# Patient Record
Sex: Male | Born: 1960 | Race: White | Hispanic: No | Marital: Single | Smoking: Never smoker
Health system: Southern US, Community
[De-identification: ages and names within clinical notes are randomized; demographics above are authoritative.]

## PROBLEM LIST (undated history)

## (undated) DIAGNOSIS — T7840XA Allergy, unspecified, initial encounter: Secondary | ICD-10-CM

## (undated) DIAGNOSIS — N200 Calculus of kidney: Secondary | ICD-10-CM

## (undated) DIAGNOSIS — I10 Essential (primary) hypertension: Secondary | ICD-10-CM

## (undated) DIAGNOSIS — D689 Coagulation defect, unspecified: Secondary | ICD-10-CM

## (undated) DIAGNOSIS — Z87442 Personal history of urinary calculi: Secondary | ICD-10-CM

## (undated) HISTORY — DX: Calculus of kidney: N20.0

## (undated) HISTORY — PX: HERNIA REPAIR: SHX51

## (undated) HISTORY — DX: Allergy, unspecified, initial encounter: T78.40XA

## (undated) HISTORY — PX: OTHER SURGICAL HISTORY: SHX169

## (undated) HISTORY — DX: Coagulation defect, unspecified: D68.9

## (undated) HISTORY — DX: Essential (primary) hypertension: I10

## (undated) HISTORY — PX: TONSILLECTOMY: SUR1361

## (undated) HISTORY — PX: FRACTURE SURGERY: SHX138

---

## 1996-02-29 HISTORY — PX: OPEN REPAIR PERIARTICULAR FRACTURE / DISLOCATION ELBOW: SUR901

## 2003-08-19 ENCOUNTER — Emergency Department (HOSPITAL_COMMUNITY): Admission: EM | Admit: 2003-08-19 | Discharge: 2003-08-19 | Payer: Self-pay | Admitting: Emergency Medicine

## 2011-02-17 ENCOUNTER — Ambulatory Visit (INDEPENDENT_AMBULATORY_CARE_PROVIDER_SITE_OTHER): Payer: 59

## 2011-02-17 DIAGNOSIS — R05 Cough: Secondary | ICD-10-CM

## 2011-02-17 DIAGNOSIS — J159 Unspecified bacterial pneumonia: Secondary | ICD-10-CM

## 2011-02-17 DIAGNOSIS — R059 Cough, unspecified: Secondary | ICD-10-CM

## 2011-03-01 HISTORY — PX: PLANTAR FASCIA RELEASE: SHX2239

## 2011-04-20 ENCOUNTER — Ambulatory Visit (INDEPENDENT_AMBULATORY_CARE_PROVIDER_SITE_OTHER): Payer: 59 | Admitting: Family Medicine

## 2011-04-20 VITALS — BP 128/89 | HR 72 | Temp 98.4°F | Resp 16 | Ht 65.5 in | Wt 218.0 lb

## 2011-04-20 DIAGNOSIS — J329 Chronic sinusitis, unspecified: Secondary | ICD-10-CM

## 2011-04-20 DIAGNOSIS — I10 Essential (primary) hypertension: Secondary | ICD-10-CM | POA: Insufficient documentation

## 2011-04-20 DIAGNOSIS — J309 Allergic rhinitis, unspecified: Secondary | ICD-10-CM

## 2011-04-20 MED ORDER — FLUTICASONE PROPIONATE 50 MCG/ACT NA SUSP: 2.0000 | Freq: Every day | NASAL | Status: DC

## 2011-04-20 MED ORDER — CEFDINIR 300 MG PO CAPS: 300.0000 mg | ORAL_CAPSULE | Freq: Two times a day (BID) | ORAL | Status: AC

## 2011-04-20 NOTE — Progress Notes (Signed)
  Subjective:    Patient ID: Nathaniel Mosley, male    DOB: 02/11/1961, 51 y.o.   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 His sinuses are flared up again. For the last 5 or 6 days he's been blowing a lot of purulent stuff, facial pain, PND. No fever. Main coughing is from the morning drainage. He had been having symptoms off and on prior to this but this is flared and stayed.    Review of Systems Otherwise unremarkable    Objective:   Physical Exam White male no acute distress TMs normal mild tenderness on sinuses throat clear neck supple without significant nodes. Chest is clear to auscultation. Heart regular without murmurs.,.   Assessment & Plan:  Sinusitis, probably with allergic component.  See discharge instructions. No abscess at this time.

## 2011-04-20 NOTE — Patient Instructions (Signed)

## 2011-07-07 ENCOUNTER — Ambulatory Visit (INDEPENDENT_AMBULATORY_CARE_PROVIDER_SITE_OTHER): Payer: 59 | Admitting: Family Medicine

## 2011-07-07 ENCOUNTER — Encounter: Payer: Self-pay | Admitting: Family Medicine

## 2011-07-07 VITALS — BP 142/93 | HR 68 | Temp 97.9°F | Resp 16 | Ht 66.5 in | Wt 214.8 lb

## 2011-07-07 DIAGNOSIS — N200 Calculus of kidney: Secondary | ICD-10-CM | POA: Insufficient documentation

## 2011-07-07 DIAGNOSIS — I10 Essential (primary) hypertension: Secondary | ICD-10-CM

## 2011-07-07 DIAGNOSIS — J301 Allergic rhinitis due to pollen: Secondary | ICD-10-CM

## 2011-07-07 DIAGNOSIS — J302 Other seasonal allergic rhinitis: Secondary | ICD-10-CM

## 2011-07-07 LAB — LIPID PANEL
Cholesterol: 190 mg/dL (ref 0–200)
HDL: 30 mg/dL — ABNORMAL LOW (ref >39)
LDL Cholesterol: 106 mg/dL — ABNORMAL HIGH (ref 0–99)
Total CHOL/HDL Ratio: 6.3 Ratio
Triglycerides: 270 mg/dL — ABNORMAL HIGH (ref <150)
VLDL: 54 mg/dL — ABNORMAL HIGH (ref 0–40)

## 2011-07-07 LAB — COMPREHENSIVE METABOLIC PANEL
ALT: 17 U/L (ref 0–53)
AST: 19 U/L (ref 0–37)
Albumin: 4.6 g/dL (ref 3.5–5.2)
Alkaline Phosphatase: 72 U/L (ref 39–117)
BUN: 13 mg/dL (ref 6–23)
CO2: 26 mEq/L (ref 19–32)
Calcium: 9.6 mg/dL (ref 8.4–10.5)
Chloride: 105 mEq/L (ref 96–112)
Creat: 0.84 mg/dL (ref 0.50–1.35)
Glucose, Bld: 104 mg/dL — ABNORMAL HIGH (ref 70–99)
Potassium: 3.9 mEq/L (ref 3.5–5.3)
Sodium: 142 mEq/L (ref 135–145)
Total Bilirubin: 0.5 mg/dL (ref 0.3–1.2)
Total Protein: 7.2 g/dL (ref 6.0–8.3)

## 2011-07-07 LAB — CBC
HCT: 46.7 % (ref 39.0–52.0)
Hemoglobin: 16 g/dL (ref 13.0–17.0)
MCH: 29.3 pg (ref 26.0–34.0)
MCHC: 34.3 g/dL (ref 30.0–36.0)
MCV: 85.5 fL (ref 78.0–100.0)
Platelets: 234 10*3/uL (ref 150–400)
RBC: 5.46 MIL/uL (ref 4.22–5.81)
RDW: 13.7 % (ref 11.5–15.5)
WBC: 5.1 10*3/uL (ref 4.0–10.5)

## 2011-07-07 LAB — POCT URINALYSIS DIPSTICK
Bilirubin, UA: NEGATIVE
Blood, UA: NEGATIVE
Glucose, UA: NEGATIVE
Ketones, UA: NEGATIVE
Leukocytes, UA: NEGATIVE
Nitrite, UA: NEGATIVE
Spec Grav, UA: 1.02
Urobilinogen, UA: 0.2
pH, UA: 7.5

## 2011-07-07 LAB — POCT UA - MICROSCOPIC ONLY
Bacteria, U Microscopic: NEGATIVE
Casts, Ur, LPF, POC: NEGATIVE
Crystals, Ur, HPF, POC: NEGATIVE
Mucus, UA: NEGATIVE
WBC, Ur, HPF, POC: NEGATIVE
Yeast, UA: NEGATIVE

## 2011-07-07 LAB — TSH: TSH: 1.664 u[IU]/mL (ref 0.350–4.500)

## 2011-07-07 NOTE — Patient Instructions (Signed)
Allergies, Generic Allergies may happen from anything your body is sensitive to. This may be food, medicines, pollens, chemicals, and nearly anything around you in everyday life that produces allergens. An allergen is anything that causes an allergy producing substance. Heredity is often a factor in causing these problems. This means you may have some of the same allergies as your parents. Food allergies happen in all age groups. Food allergies are some of the most severe and life threatening. Some common food allergies are cow's milk, seafood, eggs, nuts, wheat, and soybeans. SYMPTOMS   Swelling around the mouth.   An itchy red rash or hives.   Vomiting or diarrhea.   Difficulty breathing.  SEVERE ALLERGIC REACTIONS ARE LIFE-THREATENING. This reaction is called anaphylaxis. It can cause the mouth and throat to swell and cause difficulty with breathing and swallowing. In severe reactions only a trace amount of food (for example, peanut oil in a salad) may cause death within seconds. Seasonal allergies occur in all age groups. These are seasonal because they usually occur during the same season every year. They may be a reaction to molds, grass pollens, or tree pollens. Other causes of problems are house dust mite allergens, pet dander, and mold spores. The symptoms often consist of nasal congestion, a runny itchy nose associated with sneezing, and tearing itchy eyes. There is often an associated itching of the mouth and ears. The problems happen when you come in contact with pollens and other allergens. Allergens are the particles in the air that the body reacts to with an allergic reaction. This causes you to release allergic antibodies. Through a chain of events, these eventually cause you to release histamine into the blood stream. Although it is meant to be protective to the body, it is this release that causes your discomfort. This is why you were given anti-histamines to feel better. If you are  unable to pinpoint the offending allergen, it may be determined by skin or blood testing. Allergies cannot be cured but can be controlled with medicine. Hay fever is a collection of all or some of the seasonal allergy problems. It may often be treated with simple over-the-counter medicine such as diphenhydramine. Take medicine as directed. Do not drink alcohol or drive while taking this medicine. Check with your caregiver or package insert for child dosages. If these medicines are not effective, there are many new medicines your caregiver can prescribe. Stronger medicine such as nasal spray, eye drops, and corticosteroids may be used if the first things you try do not work well. Other treatments such as immunotherapy or desensitizing injections can be used if all else fails. Follow up with your caregiver if problems continue. These seasonal allergies are usually not life threatening. They are generally more of a nuisance that can often be handled using medicine. HOME CARE INSTRUCTIONS   If unsure what causes a reaction, keep a diary of foods eaten and symptoms that follow. Avoid foods that cause reactions.   If hives or rash are present:   Take medicine as directed.   You may use an over-the-counter antihistamine (diphenhydramine) for hives and itching as needed.   Apply cold compresses (cloths) to the skin or take baths in cool water. Avoid hot baths or showers. Heat will make a rash and itching worse.   If you are severely allergic:   Following a treatment for a severe reaction, hospitalization is often required for closer follow-up.   Wear a medic-alert bracelet or necklace stating the allergy.     You and your family must learn how to give adrenaline or use an anaphylaxis kit.   If you have had a severe reaction, always carry your anaphylaxis kit or EpiPen with you. Use this medicine as directed by your caregiver if a severe reaction is occurring. Failure to do so could have a fatal  outcome.  SEEK MEDICAL CARE IF:  You suspect a food allergy. Symptoms generally happen within 30 minutes of eating a food.   Your symptoms have not gone away within 2 days or are getting worse.   You develop new symptoms.   You want to retest yourself or your child with a food or drink you think causes an allergic reaction. Never do this if an anaphylactic reaction to that food or drink has happened before. Only do this under the care of a caregiver.  SEEK IMMEDIATE MEDICAL CARE IF:   You have difficulty breathing, are wheezing, or have a tight feeling in your chest or throat.   You have a swollen mouth, or you have hives, swelling, or itching all over your body.   You have had a severe reaction that has responded to your anaphylaxis kit or an EpiPen. These reactions may return when the medicine has worn off. These reactions should be considered life threatening.  MAKE SURE YOU:   Understand these instructions.   Will watch your condition.   Will get help right away if you are not doing well or get worse.  Document Released: 05/10/2002 Document Revised: 02/03/2011 Document Reviewed: 10/15/2007 [facility] Patient Information 2012 [place], [place].Hypertension As your heart beats, it forces blood through your arteries. This force is your blood pressure. If the pressure is too high, it is called hypertension (HTN) or high blood pressure. HTN is dangerous because you may have it and not know it. High blood pressure may mean that your heart has to work harder to pump blood. Your arteries may be narrow or stiff. The extra work puts you at risk for heart disease, stroke, and other problems.  Blood pressure consists of two numbers, a higher number over a lower, 110/72, for example. It is stated as "110 over 72." The ideal is below 120 for the top number (systolic) and under 80 for the bottom (diastolic). Write down your blood pressure today. You should pay close attention to your blood pressure if  you have certain conditions such as:  Heart failure.   Prior heart attack.   Diabetes   Chronic kidney disease.   Prior stroke.   Multiple risk factors for heart disease.  To see if you have HTN, your blood pressure should be measured while you are seated with your arm held at the level of the heart. It should be measured at least twice. A one-time elevated blood pressure reading (especially in the Emergency Department) does not mean that you need treatment. There may be conditions in which the blood pressure is different between your right and left arms. It is important to see your caregiver soon for a recheck. Most people have essential hypertension which means that there is not a specific cause. This type of high blood pressure may be lowered by changing lifestyle factors such as:  Stress.   Smoking.   Lack of exercise.   Excessive weight.   Drug/tobacco/alcohol use.   Eating less salt.  Most people do not have symptoms from high blood pressure until it has caused damage to the body. Effective treatment can often prevent, delay or reduce that damage. TREATMENT  When a cause has been identified, treatment for high blood pressure is directed at the cause. There are a large number of medications to treat HTN. These fall into several categories, and your caregiver will help you select the medicines that are best for you. Medications may have side effects. You should review side effects with your caregiver. If your blood pressure stays high after you have made lifestyle changes or started on medicines,   Your medication(s) may need to be changed.   Other problems may need to be addressed.   Be certain you understand your prescriptions, and know how and when to take your medicine.   Be sure to follow up with your caregiver within the time frame advised (usually within two weeks) to have your blood pressure rechecked and to review your medications.   If you are taking more than  one medicine to lower your blood pressure, make sure you know how and at what times they should be taken. Taking two medicines at the same time can result in blood pressure that is too low.  SEEK IMMEDIATE MEDICAL CARE IF:  You develop a severe headache, blurred or changing vision, or confusion.   You have unusual weakness or numbness, or a faint feeling.   You have severe chest or abdominal pain, vomiting, or breathing problems.  MAKE SURE YOU:   Understand these instructions.   Will watch your condition.   Will get help right away if you are not doing well or get worse.  Document Released: 02/14/2005 Document Revised: 02/03/2011 Document Reviewed: 10/05/2007 [facility] Patient Information 2012 [place], [place].Health Maintenance, Males A healthy lifestyle and preventative care can promote health and wellness.  Maintain regular health, dental, and eye exams.   Eat a healthy diet. Foods like vegetables, fruits, whole grains, low-fat dairy products, and lean protein foods contain the nutrients you need without too many calories. Decrease your intake of foods high in solid fats, added sugars, and salt. Get information about a proper diet from your caregiver, if necessary.   Regular physical exercise is one of the most important things you can do for your health. Most adults should get at least 150 minutes of moderate-intensity exercise (any activity that increases your heart rate and causes you to sweat) each week. In addition, most adults need muscle-strengthening exercises on 2 or more days a week.    Maintain a healthy weight. The body mass index (BMI) is a screening tool to identify possible weight problems. It provides an estimate of body fat based on height and weight. Your caregiver can help determine your BMI, and can help you achieve or maintain a healthy weight. For adults 20 years and older:   A BMI below 18.5 is considered underweight.   A BMI of 18.5 to 24.9 is normal.   A BMI  of 25 to 29.9 is considered overweight.   A BMI of 30 and above is considered obese.   Maintain normal blood lipids and cholesterol by exercising and minimizing your intake of saturated fat. Eat a balanced diet with plenty of fruits and vegetables. Blood tests for lipids and cholesterol should begin at age 89 and be repeated every 5 years. If your lipid or cholesterol levels are high, you are over 50, or you are a high risk for heart disease, you may need your cholesterol levels checked more frequently.Ongoing high lipid and cholesterol levels should be treated with medicines, if diet and exercise are not effective.   If you smoke, find out  from your caregiver how to quit. If you do not use tobacco, do not start.   If you choose to drink alcohol, do not exceed 2 drinks per day. One drink is considered to be 12 ounces (355 mL) of beer, 5 ounces (148 mL) of wine, or 1.5 ounces (44 mL) of liquor.   Avoid use of street drugs. Do not share needles with anyone. Ask for help if you need support or instructions about stopping the use of drugs.   High blood pressure causes heart disease and increases the risk of stroke. Blood pressure should be checked at least every 1 to 2 years. Ongoing high blood pressure should be treated with medicines if weight loss and exercise are not effective.   If you are 10 to 50 years old, ask your caregiver if you should take aspirin to prevent heart disease.   Diabetes screening involves taking a blood sample to check your fasting blood sugar level. This should be done once every 3 years, after age 96, if you are within normal weight and without risk factors for diabetes. Testing should be considered at a younger age or be carried out more frequently if you are overweight and have at least 1 risk factor for diabetes.   Colorectal cancer can be detected and often prevented. Most routine colorectal cancer screening begins at the age of 2 and continues through age 50. However,  your caregiver may recommend screening at an earlier age if you have risk factors for colon cancer. On a yearly basis, your caregiver may provide home test kits to check for hidden blood in the stool. Use of a small camera at the end of a tube, to directly examine the colon (sigmoidoscopy or colonoscopy), can detect the earliest forms of colorectal cancer. Talk to your caregiver about this at age 55, when routine screening begins. Direct examination of the colon should be repeated every 5 to 10 years through age 31, unless early forms of pre-cancerous polyps or small growths are found.   Hepatitis C blood testing is recommended for all people born from [birth year] through 1965 and any individual with known risks for hepatitis C.   Healthy men should no longer receive prostate-specific antigen (PSA) blood tests as part of routine cancer screening. Consult with your caregiver about prostate cancer screening.   Testicular cancer screening is not recommended for adolescents or adult males who have no symptoms. Screening includes self-exam, caregiver exam, and other screening tests. Consult with your caregiver about any symptoms you have or any concerns you have about testicular cancer.   Practice safe sex. Use condoms and avoid high-risk sexual practices to reduce the spread of sexually transmitted infections (STIs).   Use sunscreen with a sun protection factor (SPF) of 30 or greater. Apply sunscreen liberally and repeatedly throughout the day. You should seek shade when your shadow is shorter than you. Protect yourself by wearing long sleeves, pants, a wide-brimmed hat, and sunglasses year round, whenever you are outdoors.   Notify your caregiver of new moles or changes in moles, especially if there is a change in shape or color. Also notify your caregiver if a mole is larger than the size of a pencil eraser.   A one-time screening for abdominal aortic aneurysm (AAA) and surgical repair of large AAAs by sound  wave imaging (ultrasonography) is recommended for ages 56 to 50 years who are current or former smokers.   Stay current with your immunizations.  Document Released: 08/13/2007 Document Revised: 02/03/2011  Document Reviewed: 07/12/2010 [facility] Patient Information 2012 [place], [place].

## 2011-07-07 NOTE — Progress Notes (Signed)
  Patient Name: Nathaniel Mosley Date of Birth: 1960-06-10 Medical Record Number: [id number] Gender: male Date of Encounter: 07/07/2011  History of Present Illness:  Nathaniel Mosley is a 51 y.o. 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  Has h/o left sided kidney stone   Patient Active Problem List  Diagnoses  . HTN (hypertension)  . Seasonal allergies  . Kidney stone   Past Medical History  Diagnosis Date  . Hypertension   . Allergy   . Kidney stone on left side    Past Surgical History  Procedure Date  . Plantar fascia release 2013  . Open repair periarticular fracture / dislocation elbow 1998   History  Substance Use Topics  . Smoking status: Never Smoker   . Smokeless tobacco: Not on file  . Alcohol Use: Not on file   Family History  Problem Relation Age of Onset  . Cancer Mother   . Diabetes Father    Allergies  Allergen Reactions  . Azithromycin Other (See Comments)    GI upset    Medication list has been reviewed and updated.  Review of Systems: Negative except as in PMHx and above  Physical Examination: Filed Vitals:   07/07/11 0755  BP: 142/93  Pulse: 68  Temp: 97.9 F (36.6 C)  Resp: 16  Height: 5' 6.5" (1.689 m)  Weight: 214 lb 12.8 oz (97.433 kg)  BP recheck 130/90  Body mass index is 34.15 kg/(m^2).  GEN: WDWN, NAD, Non-toxic, A & O x 3, overweight HEENT: Atraumatic, Normocephalic. Neck supple. No masses, No LAD. Ears and Nose: No external deformity. CV: RRR, No M/G/R. No JVD. No thrill. No extra heart sounds. PULM: CTA B, no wheezes, crackles, rhonchi. No retractions. No resp. distress. No accessory muscle use. ABD: S, NT, ND, +BS. No rebound. No HSM. Genitalia:  normal EXTR: No c/c/e NEURO Normal gait.  PSYCH: Normally interactive. Conversant. Not depressed  or anxious appearing.  Calm demeanor. Skin: scar right elbow   Assessment and Plan: 1. Seasonal allergies    2. Kidney stone  Comprehensive metabolic panel, POCT urinalysis dipstick, POCT UA - Microscopic Only  3. Hypertension  CBC, Lipid panel, TSH, POCT urinalysis dipstick, POCT UA - Microscopic Only  continue current meds:  Amlodipine for BP  flonase and zyrtec for the allergies  Also, encouraged increase exercise and weight loss  -Elvina Sidle, MD

## 2011-07-08 ENCOUNTER — Encounter: Payer: Self-pay | Admitting: Family Medicine

## 2011-07-09 ENCOUNTER — Telehealth: Payer: Self-pay

## 2011-07-09 NOTE — Telephone Encounter (Signed)
Informed the lab, they will cb

## 2011-07-09 NOTE — Telephone Encounter (Signed)
Returning our call about labs, did not see an open call

## 2011-12-21 ENCOUNTER — Other Ambulatory Visit: Payer: Self-pay | Admitting: Family Medicine

## 2011-12-21 ENCOUNTER — Other Ambulatory Visit: Payer: Self-pay | Admitting: *Deleted

## 2011-12-21 MED ORDER — AMLODIPINE BESYLATE 5 MG PO TABS: 5.0000 mg | ORAL_TABLET | Freq: Every day | ORAL | Status: DC

## 2011-12-29 ENCOUNTER — Encounter: Payer: Self-pay | Admitting: Family Medicine

## 2011-12-29 ENCOUNTER — Ambulatory Visit (INDEPENDENT_AMBULATORY_CARE_PROVIDER_SITE_OTHER): Payer: 59 | Admitting: Family Medicine

## 2011-12-29 VITALS — BP 130/100 | HR 65 | Temp 98.0°F | Resp 16 | Ht 66.0 in | Wt 217.0 lb

## 2011-12-29 DIAGNOSIS — R590 Localized enlarged lymph nodes: Secondary | ICD-10-CM

## 2011-12-29 DIAGNOSIS — R599 Enlarged lymph nodes, unspecified: Secondary | ICD-10-CM

## 2011-12-29 LAB — CBC WITH DIFFERENTIAL/PLATELET
Basophils Absolute: 0 10*3/uL (ref 0.0–0.1)
Basophils Relative: 0 % (ref 0–1)
Eosinophils Absolute: 0.1 10*3/uL (ref 0.0–0.7)
Eosinophils Relative: 3 % (ref 0–5)
HCT: 44.2 % (ref 39.0–52.0)
Hemoglobin: 15.8 g/dL (ref 13.0–17.0)
Lymphocytes Relative: 31 % (ref 12–46)
Lymphs Abs: 1.6 10*3/uL (ref 0.7–4.0)
MCH: 29.6 pg (ref 26.0–34.0)
MCHC: 35.7 g/dL (ref 30.0–36.0)
MCV: 82.8 fL (ref 78.0–100.0)
Monocytes Absolute: 0.3 10*3/uL (ref 0.1–1.0)
Monocytes Relative: 6 % (ref 3–12)
Neutro Abs: 3.1 10*3/uL (ref 1.7–7.7)
Neutrophils Relative %: 60 % (ref 43–77)
Platelets: 215 10*3/uL (ref 150–400)
RBC: 5.34 MIL/uL (ref 4.22–5.81)
RDW: 14 % (ref 11.5–15.5)
WBC: 5.1 10*3/uL (ref 4.0–10.5)

## 2011-12-29 LAB — POCT SEDIMENTATION RATE: POCT SED RATE: 18 mm/hr (ref 0–22)

## 2011-12-29 NOTE — Progress Notes (Signed)
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  Nontender, no regional problems such as dental problem or skin problem.  He has seasonal allergies. No unexplained fevers recently, no weight loss, no night sweats.  Objective:  NAD 1/2 cm firm, fixed elliptical node right infra-auricular subQ nodule Oroph: clear TM's:  Normal Palpation of rest of head and neck: normal Skin:  No suspicious rash or lesions in the head and neck region Results for orders placed in visit on 07/07/11  CBC      Component Value Range   WBC 5.1  4.0 - 10.5 K/uL   RBC 5.46  4.22 - 5.81 MIL/uL   Hemoglobin 16.0  13.0 - 17.0 g/dL   HCT 16.1  09.6 - 04.5 %   MCV 85.5  78.0 - 100.0 fL   MCH 29.3  26.0 - 34.0 pg   MCHC 34.3  30.0 - 36.0 g/dL   RDW 40.9  81.1 - 91.4 %   Platelets 234  150 - 400 K/uL  LIPID PANEL      Component Value Range   Cholesterol 190  0 - 200 mg/dL   Triglycerides 782 (*) <150 mg/dL   HDL 30 (*) >95 mg/dL   Total CHOL/HDL Ratio 6.3     VLDL 54 (*) 0 - 40 mg/dL   LDL Cholesterol 621 (*) 0 - 99 mg/dL  COMPREHENSIVE METABOLIC PANEL      Component Value Range   Sodium 142  135 - 145 mEq/L   Potassium 3.9  3.5 - 5.3 mEq/L   Chloride 105  96 - 112 mEq/L   CO2 26  19 - 32 mEq/L   Glucose, Bld 104 (*) 70 - 99 mg/dL   BUN 13  6 - 23 mg/dL   Creat 3.08  6.57 - 8.46 mg/dL   Total Bilirubin 0.5  0.3 - 1.2 mg/dL   Alkaline Phosphatase 72  39 - 117 U/L   AST 19  0 - 37 U/L   ALT 17  0 - 53 U/L   Total Protein 7.2  6.0 - 8.3 g/dL   Albumin 4.6  3.5 - 5.2 g/dL   Calcium 9.6  8.4 - 96.2 mg/dL  TSH      Component Value Range   TSH 1.664  0.350 - 4.500 uIU/mL  POCT URINALYSIS DIPSTICK      Component Value Range   Color, UA yellow     Clarity, UA clear     Glucose, UA neg     Bilirubin, UA neg     Ketones, UA neg     Spec Grav, UA 1.020     Blood, UA neg     pH, UA 7.5     Protein, UA trace     Urobilinogen, UA 0.2     Nitrite, UA neg     Leukocytes, UA Negative    POCT UA - MICROSCOPIC ONLY      Component Value Range   WBC, Ur, HPF, POC neg     RBC, urine, microscopic 0-1     Bacteria, U Microscopic neg     Mucus, UA neg     Epithelial cells, urine per micros 0-1     Crystals, Ur, HPF, POC neg     Casts, Ur, LPF, POC neg     Yeast, UA neg       Assessment: isolated node.  Most likely an inflammatory  node, yet the character of the node warrants further investigation.  Plan: CBC, sed rate Referral to ENT

## 2011-12-29 NOTE — Patient Instructions (Addendum)
Cervical Adenitis You have a swollen lymph gland in your neck. This commonly happens with Strep and virus infections, dental problems, insect bites, and injuries about the face, scalp, or neck. The lymph glands swell as the body fights the infection or heals the injury. Swelling and firmness typically lasts for several weeks after the infection or injury is healed. Rarely lymph glands can become swollen because of cancer or TB. Antibiotics are prescribed if there is evidence of an infection. Sometimes an infected lymph gland becomes filled with pus. This condition may require opening up the abscessed gland by draining it surgically. Most of the time infected glands return to normal within two weeks. Do not poke or squeeze the swollen lymph nodes. That may keep them from shrinking back to their normal size. If the lymph gland is still swollen after 2 weeks, further medical evaluation is needed.  SEEK IMMEDIATE MEDICAL CARE IF:  You have difficulty swallowing or breathing, increased swelling, severe pain, or a high fever.  Document Released: 02/14/2005 Document Revised: 05/09/2011 Document Reviewed: 08/06/2006 [facility] Patient Information 2013 [place], [place]. Lymphadenopathy Lymphadenopathy means "disease of the lymph glands." But the term is usually used to describe swollen or enlarged lymph glands, also called lymph nodes. These are the bean-shaped organs found in many locations including the neck, underarm, and groin. Lymph glands are part of the immune system, which fights infections in your body. Lymphadenopathy can occur in just one area of the body, such as the neck, or it can be generalized, with lymph node enlargement in several areas. The nodes found in the neck are the most common sites of lymphadenopathy. CAUSES  When your immune system responds to germs (such as viruses or bacteria ), infection-fighting cells and fluid build up. This causes the glands to grow in size. This is usually not  something to worry about. Sometimes, the glands themselves can become infected and inflamed. This is called lymphadenitis. Enlarged lymph nodes can be caused by many diseases:  Bacterial disease, such as strep throat or a skin infection.  Viral disease, such as a common cold.  Other germs, such as lyme disease, tuberculosis, or sexually transmitted diseases.  Cancers, such as lymphoma (cancer of the lymphatic system) or leukemia (cancer of the white blood cells).  Inflammatory diseases such as lupus or rheumatoid arthritis.  Reactions to medications. Many of the diseases above are rare, but important. This is why you should see your caregiver if you have lymphadenopathy. SYMPTOMS   Swollen, enlarged lumps in the neck, back of the head or other locations.  Tenderness.  Warmth or redness of the skin over the lymph nodes.  Fever. DIAGNOSIS  Enlarged lymph nodes are often near the source of infection. They can help healthcare providers diagnose your illness. For instance:   Swollen lymph nodes around the jaw might be caused by an infection in the mouth.  Enlarged glands in the neck often signal a throat infection.  Lymph nodes that are swollen in more than one area often indicate an illness caused by a virus. Your caregiver most likely will know what is causing your lymphadenopathy after listening to your history and examining you. Blood tests, x-rays or other tests may be needed. If the cause of the enlarged lymph node cannot be found, and it does not go away by itself, then a biopsy may be needed. Your caregiver will discuss this with you. TREATMENT  Treatment for your enlarged lymph nodes will depend on the cause. Many times the nodes  will shrink to normal size by themselves, with no treatment. Antibiotics or other medicines may be needed for infection. Only take over-the-counter or prescription medicines for pain, discomfort or fever as directed by your caregiver. HOME CARE  INSTRUCTIONS  Swollen lymph glands usually return to normal when the underlying medical condition goes away. If they persist, contact your health-care provider. He/she might prescribe antibiotics or other treatments, depending on the diagnosis. Take any medications exactly as prescribed. Keep any follow-up appointments made to check on the condition of your enlarged nodes.  SEEK MEDICAL CARE IF:   Swelling lasts for more than two weeks.  You have symptoms such as weight loss, night sweats, fatigue or fever that does not go away.  The lymph nodes are hard, seem fixed to the skin or are growing rapidly.  Skin over the lymph nodes is red and inflamed. This could mean there is an infection. SEEK IMMEDIATE MEDICAL CARE IF:   Fluid starts leaking from the area of the enlarged lymph node.  You develop a fever of 102 F (38.9 C) or greater.  Severe pain develops (not necessarily at the site of a large lymph node).  You develop chest pain or shortness of breath.  You develop worsening abdominal pain. MAKE SURE YOU:   Understand these instructions.  Will watch your condition.  Will get help right away if you are not doing well or get worse. Document Released: 11/24/2007 Document Revised: 05/09/2011 Document Reviewed: 11/24/2007 [facility] Patient Information 2013 [place], [place]. Swollen Lymph Nodes The lymphatic system filters fluid from around cells. It is like a system of blood vessels. These channels carry lymph instead of blood. The lymphatic system is an important part of the immune (disease fighting) system. When people talk about "swollen glands in the neck," they are usually talking about swollen lymph nodes. The lymph nodes are like the little traps for infection. You and your caregiver may be able to feel lymph nodes, especially swollen nodes, in these common areas: the groin (inguinal area), armpits (axilla), and above the clavicle (supraclavicular). You may also feel them in the  neck (cervical) and the back of the head just above the hairline (occipital). Swollen glands occur when there is any condition in which the body responds with an allergic type of reaction. For instance, the glands in the neck can become swollen from insect bites or any type of minor infection on the head. These are very noticeable in children with only minor problems. Lymph nodes may also become swollen when there is a tumor or problem with the lymphatic system, such as Hodgkin's disease. TREATMENT   Most swollen glands do not require treatment. They can be observed (watched) for a short period of time, if your caregiver feels it is necessary. Most of the time, observation is not necessary.  Antibiotics (medicines that kill germs) may be prescribed by your caregiver. Your caregiver may prescribe these if he or she feels the swollen glands are due to a bacterial (germ) infection. Antibiotics are not used if the swollen glands are caused by a virus. HOME CARE INSTRUCTIONS   Take medications as directed by your caregiver. Only take over-the-counter or prescription medicines for pain, discomfort, or fever as directed by your caregiver. SEEK MEDICAL CARE IF:   If you begin to run a temperature greater than 102 F (38.9 C), or as your caregiver suggests. MAKE SURE YOU:   Understand these instructions.  Will watch your condition.  Will get help right away if you  are not doing well or get worse. Document Released: 02/04/2002 Document Revised: 05/09/2011 Document Reviewed: 02/14/2005 [facility] Patient Information 2013 [place], [place].

## 2012-01-05 ENCOUNTER — Other Ambulatory Visit: Payer: Self-pay | Admitting: Otolaryngology

## 2012-01-05 DIAGNOSIS — R220 Localized swelling, mass and lump, head: Secondary | ICD-10-CM

## 2012-01-10 ENCOUNTER — Ambulatory Visit
Admission: RE | Admit: 2012-01-10 | Discharge: 2012-01-10 | Disposition: A | Discharge status: Home / Self Care | Payer: 59 | Source: Ambulatory Visit | Attending: Otolaryngology | Admitting: Otolaryngology

## 2012-01-10 DIAGNOSIS — R221 Localized swelling, mass and lump, neck: Secondary | ICD-10-CM

## 2012-01-10 DIAGNOSIS — R220 Localized swelling, mass and lump, head: Secondary | ICD-10-CM

## 2012-01-10 MED ORDER — IOHEXOL 300 MG/ML  SOLN
75.0000 mL | Freq: Once | INTRAMUSCULAR | Status: AC | PRN
  Administered 2012-01-10: 75 mL via INTRAVENOUS

## 2012-02-20 ENCOUNTER — Other Ambulatory Visit: Payer: Self-pay | Admitting: Physician Assistant

## 2012-03-26 ENCOUNTER — Other Ambulatory Visit: Payer: Self-pay | Admitting: Physician Assistant

## 2012-05-31 ENCOUNTER — Other Ambulatory Visit: Payer: Self-pay | Admitting: Physician Assistant

## 2012-07-03 ENCOUNTER — Other Ambulatory Visit: Payer: Self-pay | Admitting: Physician Assistant

## 2012-09-30 ENCOUNTER — Other Ambulatory Visit: Payer: Self-pay | Admitting: Family Medicine

## 2012-10-06 ENCOUNTER — Ambulatory Visit (INDEPENDENT_AMBULATORY_CARE_PROVIDER_SITE_OTHER): Payer: 59 | Admitting: Family Medicine

## 2012-10-06 VITALS — BP 132/84 | HR 64 | Temp 98.2°F | Resp 17 | Ht 66.0 in | Wt 219.0 lb

## 2012-10-06 DIAGNOSIS — B356 Tinea cruris: Secondary | ICD-10-CM

## 2012-10-06 DIAGNOSIS — B350 Tinea barbae and tinea capitis: Secondary | ICD-10-CM

## 2012-10-06 DIAGNOSIS — B360 Pityriasis versicolor: Secondary | ICD-10-CM

## 2012-10-06 DIAGNOSIS — I10 Essential (primary) hypertension: Secondary | ICD-10-CM

## 2012-10-06 DIAGNOSIS — Z0000 Encounter for general adult medical examination without abnormal findings: Secondary | ICD-10-CM

## 2012-10-06 LAB — POCT CBC
Granulocyte percent: 64.4 %G (ref 37–80)
HCT, POC: 48.7 % (ref 43.5–53.7)
Hemoglobin: 16.2 g/dL (ref 14.1–18.1)
Lymph, poc: 1.5 (ref 0.6–3.4)
MCH, POC: 30.2 pg (ref 27–31.2)
MCHC: 33.3 g/dL (ref 31.8–35.4)
MCV: 90.6 fL (ref 80–97)
MID (cbc): 0.3 (ref 0–0.9)
MPV: 9 fL (ref 0–99.8)
POC Granulocyte: 3.2 (ref 2–6.9)
POC LYMPH PERCENT: 30.5 %L (ref 10–50)
POC MID %: 5.1 %M (ref 0–12)
Platelet Count, POC: 212 10*3/uL (ref 142–424)
RBC: 5.37 M/uL (ref 4.69–6.13)
RDW, POC: 14.3 %
WBC: 5 10*3/uL (ref 4.6–10.2)

## 2012-10-06 LAB — COMPREHENSIVE METABOLIC PANEL
ALT: 17 U/L (ref 0–53)
AST: 17 U/L (ref 0–37)
Albumin: 4.4 g/dL (ref 3.5–5.2)
Alkaline Phosphatase: 71 U/L (ref 39–117)
BUN: 10 mg/dL (ref 6–23)
CO2: 27 mEq/L (ref 19–32)
Calcium: 9.1 mg/dL (ref 8.4–10.5)
Chloride: 105 mEq/L (ref 96–112)
Creat: 0.9 mg/dL (ref 0.50–1.35)
Glucose, Bld: 99 mg/dL (ref 70–99)
Potassium: 4.1 mEq/L (ref 3.5–5.3)
Sodium: 140 mEq/L (ref 135–145)
Total Bilirubin: 0.5 mg/dL (ref 0.3–1.2)
Total Protein: 7.1 g/dL (ref 6.0–8.3)

## 2012-10-06 LAB — POCT URINALYSIS DIPSTICK
Bilirubin, UA: NEGATIVE
Blood, UA: NEGATIVE
Glucose, UA: NEGATIVE
Ketones, UA: NEGATIVE
Leukocytes, UA: NEGATIVE
Nitrite, UA: NEGATIVE
Protein, UA: NEGATIVE
Spec Grav, UA: 1.025
Urobilinogen, UA: 0.2
pH, UA: 6

## 2012-10-06 LAB — PSA: PSA: 0.83 ng/mL (ref <=4.00)

## 2012-10-06 LAB — LIPID PANEL
Cholesterol: 161 mg/dL (ref 0–200)
HDL: 33 mg/dL — ABNORMAL LOW (ref >39)
LDL Cholesterol: 85 mg/dL (ref 0–99)
Total CHOL/HDL Ratio: 4.9 Ratio
Triglycerides: 213 mg/dL — ABNORMAL HIGH (ref <150)
VLDL: 43 mg/dL — ABNORMAL HIGH (ref 0–40)

## 2012-10-06 MED ORDER — AMLODIPINE BESYLATE 5 MG PO TABS: 5.0000 mg | ORAL_TABLET | Freq: Every day | ORAL | Status: DC

## 2012-10-06 MED ORDER — FLUCONAZOLE 200 MG PO TABS: 200.0000 mg | ORAL_TABLET | Freq: Every day | ORAL | Status: DC

## 2012-10-06 MED ORDER — KETOCONAZOLE 2 % EX CREA: TOPICAL_CREAM | Freq: Every day | CUTANEOUS | Status: DC

## 2012-10-06 NOTE — Progress Notes (Signed)
Patient ID: Nathaniel Mosley MRN: [id number], DOB: 02/14/1961, 52 y.o. Date of Encounter: 10/06/2012, 9:26 AM  Primary Physician: Elvina Sidle, MD  Chief Complaint: HTN  HPI: 52 y.o. year old male with history below presents for hypertension follow up.  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 The plantar fasciitis is been a problem that he is managing to do the recumbent bike as exercise. He ran out of medicine a month ago but has continued his daily activities.  Patient does complain about rash in the axillae and groin. This is itchy and burns at times. He also has a rash on his forearms and back. No CP, HA, visual changes, or focal deficits.   Past Medical History  Diagnosis Date  . Hypertension   . Allergy   . Kidney stone on left side      Home Meds: Prior to Admission medications   Medication Sig Start Date End Date Taking? Authorizing Provider  acetaminophen (TYLENOL) 500 MG tablet Take 500 mg by mouth as needed.   Yes Historical Provider, MD  amLODipine (NORVASC) 5 MG tablet TAKE 1 TABLET BY MOUTH EVERY DAY FOR BLOOD PRESSURE 03/26/12  Yes Ryan M Dunn, PA-C  amLODipine (NORVASC) 5 MG tablet TAKE 1 TABLET BY MOUTH EVERY DAY 07/03/12  Yes Elvina Sidle, MD  cetirizine (ZYRTEC) 10 MG tablet Take 10 mg by mouth daily.   Yes Historical Provider, MD  fluticasone (FLONASE) 50 MCG/ACT nasal spray Place 2 sprays into the nose daily. 04/20/11 10/06/12 Yes Peyton Najjar, MD    Allergies:  Allergies  Allergen Reactions  . Azithromycin Other (See Comments)    GI upset    History   Social History  . Marital Status: Single    Spouse Name: N/A    Number of Children: N/A  . Years of Education: N/A   Occupational History  . Not on file.   Social History Main Topics  . Smoking status: Never Smoker   . Smokeless tobacco: Not on file  . Alcohol Use: No  . Drug Use: No  . Sexually Active: No   Other Topics Concern  . Not on file    Social History Narrative  . No narrative on file     Family History  Problem Relation Age of Onset  . Cancer Mother   . Diabetes Father     Review of Systems: Constitutional: negative for chills, fever, night sweats, weight changes, or fatigue  HEENT: negative for vision changes, hearing loss, congestion, rhinorrhea, ST, epistaxis, or sinus pressure Cardiovascular: negative for chest pain, palpitations, or DOE Respiratory: negative for hemoptysis, wheezing, shortness of breath, or cough Abdominal: negative for abdominal pain, nausea, vomiting, diarrhea, or constipation Neurologic: negative for headache, dizziness, or syncope All other systems reviewed and are otherwise negative with the exception to those above and in the HPI.   Physical Exam: Blood pressure 132/84, pulse 64, temperature 98.2 F (36.8 C), temperature source Oral, resp. rate 17, height 5\' 6"  (1.676 m), weight 219 lb (99.338 kg), SpO2 95.00%., Body mass index is 35.36 kg/(m^2). General: Well developed, well nourished, in no acute distress. Head: Normocephalic, atraumatic, eyes without discharge, sclera non-icteric, nares are without discharge. Bilateral auditory canals clear, TM's are without perforation, pearly grey and translucent with reflective cone of light bilaterally. Oral cavity moist, posterior pharynx without exudate, erythema, peritonsillar abscess, or post nasal drip.  Neck: Supple. No thyromegaly. Full ROM. No lymphadenopathy. No carotid  bruits. Lungs: Clear bilaterally to auscultation without wheezes, rales, or rhonchi. Breathing is unlabored. Heart: RRR with S1 S2. No murmurs, rubs, or gallops appreciated.  Abdomen: Soft, non-tender, non-distended with normoactive bowel sounds. No hepatosplenomegaly. No rebound/guarding. No obvious abdominal masses. Msk:  Strength and tone normal for age. Extremities/Skin: Warm and dry. No clubbing or cyanosis. No edema. No suspicious lesions. Distal pulses 2+ and equal  bilaterally. He has a rash consistent with tinea versicolor with salmon blotchy areas on his forearms and back. Also has a lichenified change in both axillae. Neuro: Alert and oriented X 3. Moves all extremities spontaneously. Gait is normal. CNII-XII grossly in tact. DTR 2+, cerebellar function intact. Rhomberg normal. Psych:  Responds to questions appropriately with a normal affect.   Labs:  CMP pending  ASSESSMENT AND PLAN:  52 y.o. year old male with hypertension, tinea versicolor, and tinea pruritus. The health maintenance issues also need to be addressed with colonoscopy and EKG. Hypertension - Plan: amLODipine (NORVASC) 5 MG tablet, EKG 12-Lead, EKG 12-Lead  Tinea versicolor - Plan: fluconazole (DIFLUCAN) 200 MG tablet  Tinea cruris - Plan: ketoconazole (NIZORAL) 2 % cream  Routine general medical examination at a health care facility - Plan: Ambulatory referral to Gastroenterology, POCT CBC, Comprehensive metabolic panel, Lipid panel, PSA, POCT urinalysis dipstick   -  Signed, Elvina Sidle, MD 10/06/2012 9:26 AM

## 2012-10-06 NOTE — Patient Instructions (Addendum)
Jock Itch Jock itch is a fungal infection of the skin in the groin area. It is sometimes called "ringworm" even though it is not caused by a worm. A fungus is a type of germ that thrives in dark, damp places.  CAUSES  This infection may spread from:  A fungus infection elsewhere on the body (such as athlete's foot).  Sharing towels or clothing. This infection is more common in:  Hot, humid climates.  People who wear tight-fitting clothing or wet bathing suits for long periods of time.  Athletes.  Overweight people.  People with diabetes. SYMPTOMS  Jock itch causes the following symptoms:  Red, pink or brown rash in the groin. Rash may spread to the thighs, anus, and buttocks.  Itching. DIAGNOSIS  Your caregiver may make the diagnosis by looking at the rash. Sometimes a skin scraping will be sent to test for fungus. Testing can be done either by looking under the microscope or by doing a culture (test to try to grow the fungus). A culture can take up to 2 weeks to come back. TREATMENT  Jock itch may be treated with:  Skin cream or ointment to kill fungus.  Medicine by mouth to kill fungus.  Skin cream or ointment to calm the itching.  Compresses or medicated powders to dry the infected skin. HOME CARE INSTRUCTIONS   Be sure to treat the rash completely. Follow your caregiver's instructions. It can take a couple of weeks to treat. If you do not treat the infection long enough, the rash can come back.  Wear loose-fitting clothing.  Men should wear cotton boxer shorts.  Women should wear cotton underwear.  Avoid hot baths.  Dry the groin area well after bathing. SEEK MEDICAL CARE IF:   Your rash is worse.  Your rash is spreading.  Your rash returns after treatment is finished.  Your rash is not gone in 4 weeks. Fungal infections are slow to respond to treatment. Some redness may remain for several weeks after the fungus is gone. SEEK IMMEDIATE MEDICAL CARE  IF:  The area becomes red, warm, tender, and swollen.  You have a fever. Document Released: 02/04/2002 Document Revised: 05/09/2011 Document Reviewed: 01/04/2008 [facility] Patient Information 2014 [place], [place]. Tinea Versicolor Tinea versicolor is a common yeast infection of the skin. This condition becomes known when the yeast on our skin starts to overgrow (yeast is a normal inhabitant on our skin). This condition is noticed as white or light brown patches on brown skin, and is more evident in the summer on tanned skin. These areas are slightly scaly if scratched. The light patches from the yeast become evident when the yeast creates "holes in your suntan". This is most often noticed in the summer. The patches are usually located on the chest, back, pubis, neck and body folds. However, it may occur on any area of body. Mild itching and inflammation (redness or soreness) may be present. DIAGNOSIS  The diagnosisof this is made clinically (by looking). Cultures from samples are usually not needed. Examination under the microscope may help. However, yeast is normally found on skin. The diagnosis still remains clinical. Examination under Wood's Ultraviolet Light can determine the extent of the infection. TREATMENT  This common infection is usually only of cosmetic (only a concern to your appearance). It is easily treated with dandruff shampoo used during showers or bathing. Vigorous scrubbing will eliminate the yeast over several days time. The light areas in your skin may remain for weeks or months  after the infection is cured unless your skin is exposed to sunlight. The lighter or darker spots caused by the fungus that remain after complete treatment are not a sign of treatment failure; it will take a long time to resolve. Your caregiver may recommend a number of commercial preparations or medication by mouth if home care is not working. Recurrence is common and preventative medication may be  necessary. This skin condition is not highly contagious. Special care is not needed to protect close friends and family members. Normal hygiene is usually enough. Follow up is required only if you develop complications (such as a secondary infection from scratching), if recommended by your caregiver, or if no relief is obtained from the preparations used. Document Released: 02/12/2000 Document Revised: 05/09/2011 Document Reviewed: 03/26/2008 [facility] Patient Information 2014 [place], [place].

## 2012-10-08 ENCOUNTER — Telehealth: Payer: Self-pay

## 2012-10-08 NOTE — Telephone Encounter (Signed)
Patient called back  Call on cell phone [phone]

## 2012-10-09 NOTE — Telephone Encounter (Signed)
Labs normal. Patient advised.

## 2012-12-07 ENCOUNTER — Encounter: Payer: Self-pay | Admitting: Internal Medicine

## 2012-12-07 ENCOUNTER — Ambulatory Visit (INDEPENDENT_AMBULATORY_CARE_PROVIDER_SITE_OTHER): Payer: 59 | Admitting: Internal Medicine

## 2012-12-07 VITALS — BP 118/76 | HR 64 | Temp 98.0°F | Resp 16 | Ht 65.5 in | Wt 221.0 lb

## 2012-12-07 DIAGNOSIS — J309 Allergic rhinitis, unspecified: Secondary | ICD-10-CM

## 2012-12-07 DIAGNOSIS — J019 Acute sinusitis, unspecified: Secondary | ICD-10-CM

## 2012-12-07 MED ORDER — AMOXICILLIN 500 MG PO CAPS: 1000.0000 mg | ORAL_CAPSULE | Freq: Two times a day (BID) | ORAL | Status: DC

## 2012-12-07 MED ORDER — METHYLPREDNISOLONE ACETATE 80 MG/ML IJ SUSP
120.0000 mg | Freq: Once | INTRAMUSCULAR | Status: AC
  Administered 2012-12-07: 120 mg via INTRAMUSCULAR

## 2012-12-07 NOTE — Progress Notes (Signed)
  Subjective:    Patient ID: Nathaniel Mosley, male    DOB: 09/14/1960, 52 y.o.   MRN: [id number]  HPI Sinus congestion, cough, st. No sob, cp. Does have allergys well txed. Discharge is green and getting worse. HA but no fever  Review of Systems     Objective:   Physical Exam  Vitals reviewed. Constitutional: He is oriented to person, place, and time. He appears well-developed and well-nourished. No distress.  HENT:  Right Ear: External ear normal.  Left Ear: External ear normal.  Nose: Mucosal edema, rhinorrhea and sinus tenderness present. No epistaxis. Right sinus exhibits maxillary sinus tenderness. Left sinus exhibits maxillary sinus tenderness.  Mouth/Throat: Oropharynx is clear and moist.  Eyes: EOM are normal.  Neck: Neck supple.  Cardiovascular: Normal rate, regular rhythm and normal heart sounds.   Pulmonary/Chest: Effort normal and breath sounds normal.  Musculoskeletal: Normal range of motion.  Neurological: He is alert and oriented to person, place, and time. No cranial nerve deficit. He exhibits normal muscle tone. Coordination normal.  Psychiatric: He has a normal mood and affect. His behavior is normal.          Assessment & Plan:  Sinusitis Amoxil/Depomedrol 120mg  IM

## 2012-12-07 NOTE — Patient Instructions (Signed)
Allergic Rhinitis Allergic rhinitis is when the mucous membranes in the nose respond to allergens. Allergens are particles in the air that cause your body to have an allergic reaction. This causes you to release allergic antibodies. Through a chain of events, these eventually cause you to release histamine into the blood stream (hence the use of antihistamines). Although meant to be protective to the body, it is this release that causes your discomfort, such as frequent sneezing, congestion and an itchy runny nose.  CAUSES  The pollen allergens may come from grasses, trees, and weeds. This is seasonal allergic rhinitis, or "hay fever." Other allergens cause year-round allergic rhinitis (perennial allergic rhinitis) such as house dust mite allergen, pet dander and mold spores.  SYMPTOMS   Nasal stuffiness (congestion).  Runny, itchy nose with sneezing and tearing of the eyes.  There is often an itching of the mouth, eyes and ears. It cannot be cured, but it can be controlled with medications. DIAGNOSIS  If you are unable to determine the offending allergen, skin or blood testing may find it. TREATMENT   Avoid the allergen.  Medications and allergy shots (immunotherapy) can help.  Hay fever may often be treated with antihistamines in pill or nasal spray forms. Antihistamines block the effects of histamine. There are over-the-counter medicines that may help with nasal congestion and swelling around the eyes. Check with your caregiver before taking or giving this medicine. If the treatment above does not work, there are many new medications your caregiver can prescribe. Stronger medications may be used if initial measures are ineffective. Desensitizing injections can be used if medications and avoidance fails. Desensitization is when a patient is given ongoing shots until the body becomes less sensitive to the allergen. Make sure you follow up with your caregiver if problems continue. SEEK MEDICAL  CARE IF:   You develop fever (more than 100.5 F (38.1 C).  You develop a cough that does not stop easily (persistent).  You have shortness of breath.  You start wheezing.  Symptoms interfere with normal daily activities. Document Released: 11/09/2000 Document Revised: 05/09/2011 Document Reviewed: 05/21/2008 [facility] Patient Information 2014 [place], [place]. Sinusitis Sinusitis is redness, soreness, and swelling (inflammation) of the paranasal sinuses. Paranasal sinuses are air pockets within the bones of your face (beneath the eyes, the middle of the forehead, or above the eyes). In healthy paranasal sinuses, mucus is able to drain out, and air is able to circulate through them by way of your nose. However, when your paranasal sinuses are inflamed, mucus and air can become trapped. This can allow bacteria and other germs to grow and cause infection. Sinusitis can develop quickly and last only a short time (acute) or continue over a long period (chronic). Sinusitis that lasts for more than 12 weeks is considered chronic.  CAUSES  Causes of sinusitis include:  Allergies.  Structural abnormalities, such as displacement of the cartilage that separates your nostrils (deviated septum), which can decrease the air flow through your nose and sinuses and affect sinus drainage.  Functional abnormalities, such as when the small hairs (cilia) that line your sinuses and help remove mucus do not work properly or are not present. SYMPTOMS  Symptoms of acute and chronic sinusitis are the same. The primary symptoms are pain and pressure around the affected sinuses. Other symptoms include:  Upper toothache.  Earache.  Headache.  Bad breath.  Decreased sense of smell and taste.  A cough, which worsens when you are lying flat.  Fatigue.  Fever.  Thick drainage from your nose, which often is green and may contain pus (purulent).  Swelling and warmth over the affected sinuses. DIAGNOSIS    Your caregiver will perform a physical exam. During the exam, your caregiver may:  Look in your nose for signs of abnormal growths in your nostrils (nasal polyps).  Tap over the affected sinus to check for signs of infection.  View the inside of your sinuses (endoscopy) with a special imaging device with a light attached (endoscope), which is inserted into your sinuses. If your caregiver suspects that you have chronic sinusitis, one or more of the following tests may be recommended:  Allergy tests.  Nasal culture A sample of mucus is taken from your nose and sent to a lab and screened for bacteria.  Nasal cytology A sample of mucus is taken from your nose and examined by your caregiver to determine if your sinusitis is related to an allergy. TREATMENT  Most cases of acute sinusitis are related to a viral infection and will resolve on their own within 10 days. Sometimes medicines are prescribed to help relieve symptoms (pain medicine, decongestants, nasal steroid sprays, or saline sprays).  However, for sinusitis related to a bacterial infection, your caregiver will prescribe antibiotic medicines. These are medicines that will help kill the bacteria causing the infection.  Rarely, sinusitis is caused by a fungal infection. In theses cases, your caregiver will prescribe antifungal medicine. For some cases of chronic sinusitis, surgery is needed. Generally, these are cases in which sinusitis recurs more than 3 times per year, despite other treatments. HOME CARE INSTRUCTIONS   Drink plenty of water. Water helps thin the mucus so your sinuses can drain more easily.  Use a humidifier.  Inhale steam 3 to 4 times a day (for example, sit in the bathroom with the shower running).  Apply a warm, moist washcloth to your face 3 to 4 times a day, or as directed by your caregiver.  Use saline nasal sprays to help moisten and clean your sinuses.  Take over-the-counter or prescription medicines for  pain, discomfort, or fever only as directed by your caregiver. SEEK IMMEDIATE MEDICAL CARE IF:  You have increasing pain or severe headaches.  You have nausea, vomiting, or drowsiness.  You have swelling around your face.  You have vision problems.  You have a stiff neck.  You have difficulty breathing. MAKE SURE YOU:   Understand these instructions.  Will watch your condition.  Will get help right away if you are not doing well or get worse. Document Released: 02/14/2005 Document Revised: 05/09/2011 Document Reviewed: 03/01/2011 [facility] Patient Information 2014 [place], [place].

## 2013-04-10 ENCOUNTER — Ambulatory Visit (INDEPENDENT_AMBULATORY_CARE_PROVIDER_SITE_OTHER): Payer: 59

## 2013-04-10 ENCOUNTER — Ambulatory Visit (INDEPENDENT_AMBULATORY_CARE_PROVIDER_SITE_OTHER): Payer: 59 | Admitting: Podiatry

## 2013-04-10 ENCOUNTER — Encounter: Payer: Self-pay | Admitting: Podiatry

## 2013-04-10 VITALS — BP 167/92 | HR 74 | Resp 16 | Ht 66.0 in | Wt 215.0 lb

## 2013-04-10 DIAGNOSIS — M722 Plantar fascial fibromatosis: Secondary | ICD-10-CM

## 2013-04-10 DIAGNOSIS — M779 Enthesopathy, unspecified: Secondary | ICD-10-CM

## 2013-04-10 NOTE — Progress Notes (Signed)
   Subjective:    Patient ID: Nathaniel Mosley, male    DOB: 05/02/1960, 53 y.o.   MRN: [id number]  HPI Comments: "I just need some new orthotics"  Patient states that he has felt some tenderness in the balls of both feet. Been wearing  his current orthotics since 2011. Requesting a new pair.     Review of Systems  All other systems reviewed and are negative.       Objective:   Physical Exam        Assessment & Plan:

## 2013-04-11 NOTE — Progress Notes (Signed)
Subjective:     Patient ID: Nathaniel Mosley, male   DOB: 02-Dec-1960, 53 y.o.   MRN: [id number]  HPI patient presents stating I need new orthotics and I have been having mild/moderate pain in my feet   Review of Systems     Objective:   Physical Exam Neurovascular status intact with no health history changes noted and discomfort of a mild to moderate nature in the heel and forefoot of both feet    Assessment:     Plantar fascial symptomatology with tendinitis-like symptoms occurring bilateral    Plan:     H&P and x-rays reviewed and scanned for new customized orthotic devices discussed what to do during busy days utilizing Aleve and reappoint when orthotics are ready

## 2013-05-03 ENCOUNTER — Ambulatory Visit (INDEPENDENT_AMBULATORY_CARE_PROVIDER_SITE_OTHER): Payer: 59 | Admitting: *Deleted

## 2013-05-03 DIAGNOSIS — M779 Enthesopathy, unspecified: Secondary | ICD-10-CM

## 2013-05-03 NOTE — Progress Notes (Signed)
   Subjective:    Patient ID: Nathaniel Mosley, male    DOB: 1960/03/02, 53 y.o.   MRN: [id number]  HPI DISPENSED ORTHOTICS AND GIVEN INSTRUCTION.    Review of Systems     Objective:   Physical Exam        Assessment & Plan:

## 2013-05-03 NOTE — Patient Instructions (Signed)

## 2013-05-13 ENCOUNTER — Encounter: Payer: Self-pay | Admitting: Podiatry

## 2013-05-13 DIAGNOSIS — M722 Plantar fascial fibromatosis: Secondary | ICD-10-CM

## 2013-05-19 ENCOUNTER — Other Ambulatory Visit: Payer: Self-pay | Admitting: Family Medicine

## 2013-06-03 ENCOUNTER — Encounter: Payer: Self-pay | Admitting: Podiatry

## 2013-09-26 ENCOUNTER — Other Ambulatory Visit: Payer: Self-pay | Admitting: Family Medicine

## 2013-10-15 ENCOUNTER — Telehealth: Payer: Self-pay

## 2013-10-15 NOTE — Telephone Encounter (Addendum)
Pt is needing to talk with somone  About a mole on his temple best number is [phone]

## 2013-10-17 NOTE — Telephone Encounter (Signed)
Pt should come in to have area evaluated. We are not able to dx over the phone. Pt agrees to come to the clinic.

## 2013-10-28 ENCOUNTER — Other Ambulatory Visit: Payer: Self-pay | Admitting: Internal Medicine

## 2013-11-18 ENCOUNTER — Ambulatory Visit (INDEPENDENT_AMBULATORY_CARE_PROVIDER_SITE_OTHER): Payer: 59 | Admitting: Family Medicine

## 2013-11-18 ENCOUNTER — Encounter: Payer: Self-pay | Admitting: Family Medicine

## 2013-11-18 VITALS — BP 142/90 | HR 72 | Resp 16 | Wt 222.0 lb

## 2013-11-18 DIAGNOSIS — L989 Disorder of the skin and subcutaneous tissue, unspecified: Secondary | ICD-10-CM

## 2013-11-18 DIAGNOSIS — I10 Essential (primary) hypertension: Secondary | ICD-10-CM

## 2013-11-18 MED ORDER — AMLODIPINE BESYLATE 5 MG PO TABS: 5.0000 mg | ORAL_TABLET | Freq: Every day | ORAL | Status: DC

## 2013-11-18 NOTE — Progress Notes (Signed)
@[email or web address] @  Patient ID: Nathaniel Mosley MRN: [id number], DOB: 1961/01/31, 53 y.o. Date of Encounter: 11/18/2013, 9:08 AM  Primary Physician: Robyn Haber, MD  This chart was scribed for Robyn Haber, MD by Rayfield Citizen, medical scribe. This patient was seen in room 26 and the patient's care was started at 9:08 AM.   Chief Complaint:   HPI: 53 y.o. year old male with history below presents to [facility] with two small moles on his scalp (hairline at the left temple and in the hair above his right ear).  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   PE Notes:  Patient's blood pressure during his exam was 132/80 He notes that he has a family history (father) of melanoma   Past Medical History  Diagnosis Date  . Hypertension   . Allergy   . Kidney stone on left side      Home Meds: Prior to Admission medications   Medication Sig Start Date End Date Taking? Authorizing Provider  acetaminophen (TYLENOL) 500 MG tablet Take 500 mg by mouth as needed.   Yes Historical Provider, MD  amLODipine (NORVASC) 5 MG tablet Take 5 mg by mouth daily. 10/30/13  Yes Orma Flaming, MD  cetirizine (ZYRTEC) 10 MG tablet Take 10 mg by mouth daily.   Yes Historical Provider, MD  fluticasone (FLONASE) 50 MCG/ACT nasal spray USE 2 SPRAYS IN EACH NOSTRIL TWICE A DAY   Yes Orma Flaming, MD  ketoconazole (NIZORAL) 2 % cream Apply topically daily. 10/06/12  Yes Robyn Haber, MD    Allergies:  Allergies  Allergen Reactions  . Azithromycin Other (See Comments)    GI upset  . Erythromycin     History   Social History  . Marital Status: Single    Spouse Name: N/A    Number of Children: N/A  . Years of Education: N/A   Occupational History  . Not on file.   Social History Main Topics  . Smoking status: Never Smoker   . Smokeless tobacco: Not on file  . Alcohol Use: No  . Drug Use: No  . Sexual Activity: No   Other Topics Concern  . Not on file   Social History Narrative  . No narrative on  file     Review of Systems: Constitutional: negative for chills, fever, night sweats, weight changes, or fatigue  HEENT: negative for vision changes, hearing loss, congestion, rhinorrhea, ST, epistaxis, or sinus pressure Cardiovascular: negative for chest pain or palpitations Respiratory: negative for hemoptysis, wheezing, shortness of breath, or cough Abdominal: negative for abdominal pain, nausea, vomiting, diarrhea, or constipation Dermatological: negative for rash Neurologic: negative for headache, dizziness, or syncope All other systems reviewed and are otherwise negative with the exception to those above and in the HPI.   Physical Exam:  130/80 left arm sitting recheck Blood pressure 142/90, pulse 72, resp. rate 16, weight 222 lb (100.699 kg), SpO2 98.00%., Body mass index is 35.85 kg/(m^2). General: Well developed, well nourished, in no acute distress. Head: Normocephalic, atraumatic, eyes without discharge, sclera non-icteric, nares are without discharge. Bilateral auditory canals clear, TM's are without perforation, pearly grey and translucent with reflective cone of light bilaterally. Oral cavity moist, posterior pharynx without exudate, erythema, peritonsillar abscess, or post nasal drip.  Neck: Supple. No thyromegaly. Full ROM. No lymphadenopathy. Lungs: Clear bilaterally to auscultation without wheezes, rales, or rhonchi. Breathing is unlabored. Heart: RRR with S1 S2. No murmurs, rubs, or gallops appreciated. Abdomen: Soft, non-tender, non-distended with normoactive bowel sounds. No  hepatomegaly. No rebound/guarding. No obvious abdominal masses. Msk:  Strength and tone normal for age. Extremities/Skin: Warm and dry. No clubbing or cyanosis. No edema. No rashes or suspicious lesions. Neuro: Alert and oriented X 3. Moves all extremities spontaneously. Gait is normal. CNII-XII grossly in tact. Psych:  Responds to questions appropriately with a normal affect.   Skin:  Verrucous  growth right upper parieto-occipital area, scaly left sideburn lesion c/w seborrheic keratosis ASSESSMENT AND PLAN:  53 y.o. year old male with hypertension Essential hypertension - Plan: amLODipine (NORVASC) 5 MG tablet  Skin lesion of scalp - Plan: Dermatology pathology   Mole above his right ear excised (closed with two stitches); mole on his left temple frozen    Signed, Robyn Haber, MD 11/18/2013 9:08 AM

## 2013-11-26 ENCOUNTER — Ambulatory Visit (INDEPENDENT_AMBULATORY_CARE_PROVIDER_SITE_OTHER): Payer: 59 | Admitting: Family Medicine

## 2013-11-26 DIAGNOSIS — L989 Disorder of the skin and subcutaneous tissue, unspecified: Secondary | ICD-10-CM

## 2013-11-26 NOTE — Progress Notes (Signed)
° °  Subjective:  This chart was scribed for Merri Ray, MD by Donato Schultz, Medical Scribe. This patient was seen in Room 2 and the patient's care was started at 9:41 AM.   Patient ID: Nathaniel Mosley, male    DOB: 08/16/1960, 52 y.o.   MRN: [id number]  HPI HPI Comments: Nathaniel Mosley is a 53 y.o. male who presents to the [facility] complaining of   Patient Active Problem List   Diagnosis Date Noted   Seasonal allergies 07/07/2011   Kidney stone 07/07/2011   HTN (hypertension) 04/20/2011   Past Medical History  Diagnosis Date   Hypertension    Allergy    Kidney stone on left side    Past Surgical History  Procedure Laterality Date   Plantar fascia release  2013   Open repair periarticular fracture / dislocation elbow  1998   Allergies  Allergen Reactions   Azithromycin Other (See Comments)    GI upset   Erythromycin    Prior to Admission medications   Medication Sig Start Date End Date Taking? Authorizing Provider  acetaminophen (TYLENOL) 500 MG tablet Take 500 mg by mouth as needed.    Historical Provider, MD  amLODipine (NORVASC) 5 MG tablet Take 1 tablet (5 mg total) by mouth daily. 11/18/13   Robyn Haber, MD  cetirizine (ZYRTEC) 10 MG tablet Take 10 mg by mouth daily.    Historical Provider, MD  fluticasone (FLONASE) 50 MCG/ACT nasal spray USE 2 SPRAYS IN EACH NOSTRIL TWICE A DAY    Orma Flaming, MD  ketoconazole (NIZORAL) 2 % cream Apply topically daily. 10/06/12   Robyn Haber, MD   History   Social History   Marital Status: Single    Spouse Name: N/A    Number of Children: N/A   Years of Education: N/A   Occupational History   Not on file.   Social History Main Topics   Smoking status: Never Smoker    Smokeless tobacco: Not on file   Alcohol Use: No   Drug Use: No   Sexual Activity: No   Other Topics Concern   Not on file   Social History Narrative   No narrative on file     Review of  Systems     Objective:   Physical Exam  Vitals reviewed. Constitutional: He is oriented to person, place, and time. He appears well-developed and well-nourished.  HENT:  Head: Normocephalic and atraumatic.  Eyes: EOM are normal. Pupils are equal, round, and reactive to light.  Neck: No JVD present. Carotid bruit is not present.  Cardiovascular: Normal rate, regular rhythm and normal heart sounds.   No murmur heard. Pulmonary/Chest: Effort normal and breath sounds normal. He has no rales.  Musculoskeletal: He exhibits no edema.  Neurological: He is alert and oriented to person, place, and time.  Skin: Skin is warm and dry.  Psychiatric: He has a normal mood and affect.      There were no vitals filed for this visit. Assessment & Plan:    Well-healed biopsy site

## 2013-11-26 NOTE — Progress Notes (Signed)
   Subjective:    Patient ID: Nathaniel Mosley, male    DOB: 09/08/1960, 53 y.o.   MRN: [id number]  HPI Patient presents for suture removal.    Review of Systems     Objective:   Physical Exam Well healed surgical site from mole excision. No drainage noted. 2 sutures removed following verbal consent. Patient tolerated well with no complaints of pain, Dr Joseph Art advised and no further is needed. Patient may wash/dry area as usual       Assessment & Plan:  Follow up as needed.

## 2014-01-01 ENCOUNTER — Other Ambulatory Visit: Payer: Self-pay | Admitting: Family Medicine

## 2014-01-02 NOTE — Telephone Encounter (Signed)
Dr L, you have seen pt recently, but not for this med. You did give RFs in the past so must be a chronic problem. Do you want to RF?

## 2014-02-17 ENCOUNTER — Ambulatory Visit (INDEPENDENT_AMBULATORY_CARE_PROVIDER_SITE_OTHER): Payer: 59 | Admitting: Family Medicine

## 2014-02-17 VITALS — BP 130/84 | HR 82 | Temp 99.0°F | Resp 16 | Ht 66.0 in | Wt 218.6 lb

## 2014-02-17 DIAGNOSIS — J0111 Acute recurrent frontal sinusitis: Secondary | ICD-10-CM

## 2014-02-17 DIAGNOSIS — J209 Acute bronchitis, unspecified: Secondary | ICD-10-CM

## 2014-02-17 DIAGNOSIS — R059 Cough, unspecified: Secondary | ICD-10-CM

## 2014-02-17 DIAGNOSIS — R05 Cough: Secondary | ICD-10-CM

## 2014-02-17 MED ORDER — HYDROCODONE-HOMATROPINE 5-1.5 MG/5ML PO SYRP: 5.0000 mL | ORAL_SOLUTION | Freq: Three times a day (TID) | ORAL | Status: DC | PRN

## 2014-02-17 MED ORDER — CEFDINIR 300 MG PO CAPS: 300.0000 mg | ORAL_CAPSULE | Freq: Two times a day (BID) | ORAL | Status: DC

## 2014-02-17 NOTE — Patient Instructions (Signed)
I hope that you feel better soon!  Use the antibiotic as directed and the hycodan cough syrup as needed.   Remember the cough syrup can make you sleepy so do not use when you need to drive Let me know if you are not improved in 3-4 days

## 2014-02-17 NOTE — Progress Notes (Signed)
[facility] [address], [place] [address] [phone]  Date:  02/17/2014   Name:  Nathaniel Mosley   DOB:  [birth year]   MRN:  [id number]  PCP:  Robyn Haber, MD    Chief Complaint: Sinusitis   History of Present Illness:  Nathaniel Mosley is a 53 y.o. very pleasant male patient who presents with the following:  Here today with a sinus infection for about one week.  Started with sinus pressure and drainage. Now has a cough as well- productive.  No fever but he has noted some chills. He did have some GI symptoms over the weekend- on Friday he ate some bad food and had "food poisoning" for one day but these sx are now resolved.   He has also noted a ST.  History of HTN and obesity but OW he is generally in good health  Patient Active Problem List   Diagnosis Date Noted  . Seasonal allergies 07/07/2011  . Kidney stone 07/07/2011  . HTN (hypertension) 04/20/2011    Past Medical History  Diagnosis Date  . Hypertension   . Allergy   . Kidney stone on left side     Past Surgical History  Procedure Laterality Date  . Plantar fascia release  2013  . Open repair periarticular fracture / dislocation elbow  1998    History  Substance Use Topics  . Smoking status: Never Smoker   . Smokeless tobacco: Not on file  . Alcohol Use: No    Family History  Problem Relation Age of Onset  . Cancer Mother   . Diabetes Father     Allergies  Allergen Reactions  . Azithromycin Other (See Comments)    GI upset  . Erythromycin     Medication list has been reviewed and updated.  Current Outpatient Prescriptions on File Prior to Visit  Medication Sig Dispense Refill  . acetaminophen (TYLENOL) 500 MG tablet Take 500 mg by mouth as needed.    Marland Kitchen amLODipine (NORVASC) 5 MG tablet Take 1 tablet (5 mg total) by mouth daily. 90 tablet 3  . cetirizine (ZYRTEC) 10 MG tablet Take 10 mg by mouth daily.    . fluticasone (FLONASE) 50 MCG/ACT nasal spray USE 2  SPRAYS IN EACH NOSTRIL TWICE A DAY 16 g 6  . ketoconazole (NIZORAL) 2 % cream APPLY TOPICALLY DAILY. 60 g 1   No current facility-administered medications on file prior to visit.    Review of Systems:  As per HPI- otherwise negative.   Physical Examination: Filed Vitals:   02/17/14 0814  BP: 130/84  Pulse: 82  Temp: 99 F (37.2 C)  Resp: 16   Filed Vitals:   02/17/14 0814  Height: 5\' 6"  (1.676 m)  Weight: 218 lb 9.6 oz (99.156 kg)   Body mass index is 35.3 kg/(m^2). Ideal Body Weight: Weight in (lb) to have BMI = 25: 154.6  GEN: WDWN, NAD, Non-toxic, A & O x 3, obese, looks well HEENT: Atraumatic, Normocephalic. Neck supple. No masses, No LAD.  Bilateral TM wnl, oropharynx normal.  PEERL,EOMI.   Ears and Nose: No external deformity. CV: RRR, No M/G/R. No JVD. No thrill. No extra heart sounds. PULM: CTA B, no wheezes, crackles, rhonchi. No retractions. No resp. distress. No accessory muscle use. ABD: S, NT, ND. No rebound. No HSM.  Umbilical hernia EXTR: No c/c/e NEURO Normal gait.  PSYCH: Normally interactive. Conversant. Not depressed or anxious appearing.  Calm demeanor.    Assessment and Plan:  Acute recurrent frontal sinusitis - Plan: cefdinir (OMNICEF) 300 MG capsule  Cough - Plan: HYDROcodone-homatropine (HYCODAN) 5-1.5 MG/5ML syrup  Acute bronchitis, unspecified organism - Plan: cefdinir (OMNICEF) 300 MG capsule    Signed Lamar Blinks, MD

## 2014-11-14 ENCOUNTER — Other Ambulatory Visit: Payer: Self-pay | Admitting: Family Medicine

## 2014-12-20 ENCOUNTER — Other Ambulatory Visit: Payer: Self-pay | Admitting: Family Medicine

## 2014-12-22 ENCOUNTER — Other Ambulatory Visit: Payer: Self-pay | Admitting: Internal Medicine

## 2015-01-17 ENCOUNTER — Other Ambulatory Visit: Payer: Self-pay | Admitting: Family Medicine

## 2015-02-04 ENCOUNTER — Other Ambulatory Visit: Payer: Self-pay | Admitting: Family Medicine

## 2015-02-10 NOTE — Telephone Encounter (Signed)
Called pt and notified him of need for f/up. Pt sch appt for 03/05/15 and I will send RF until then.

## 2015-03-05 ENCOUNTER — Ambulatory Visit (INDEPENDENT_AMBULATORY_CARE_PROVIDER_SITE_OTHER): Payer: Commercial Managed Care - HMO | Admitting: Urgent Care

## 2015-03-05 ENCOUNTER — Encounter: Payer: Self-pay | Admitting: Urgent Care

## 2015-03-05 VITALS — BP 120/90 | HR 65 | Temp 98.2°F | Resp 16 | Ht 65.5 in | Wt 213.8 lb

## 2015-03-05 DIAGNOSIS — E669 Obesity, unspecified: Secondary | ICD-10-CM

## 2015-03-05 DIAGNOSIS — R0789 Other chest pain: Secondary | ICD-10-CM

## 2015-03-05 DIAGNOSIS — I10 Essential (primary) hypertension: Secondary | ICD-10-CM | POA: Diagnosis not present

## 2015-03-05 DIAGNOSIS — Z0000 Encounter for general adult medical examination without abnormal findings: Secondary | ICD-10-CM | POA: Diagnosis not present

## 2015-03-05 DIAGNOSIS — M545 Low back pain: Secondary | ICD-10-CM | POA: Diagnosis not present

## 2015-03-05 LAB — CBC
HEMATOCRIT: 43.9 % (ref 39.0–52.0)
Hemoglobin: 15.8 g/dL (ref 13.0–17.0)
MCH: 30.1 pg (ref 26.0–34.0)
MCHC: 36 g/dL (ref 30.0–36.0)
MCV: 83.6 fL (ref 78.0–100.0)
MPV: 10.4 fL (ref 8.6–12.4)
Platelets: 221 10*3/uL (ref 150–400)
RBC: 5.25 MIL/uL (ref 4.22–5.81)
RDW: 14.9 % (ref 11.5–15.5)
WBC: 6.2 10*3/uL (ref 4.0–10.5)

## 2015-03-05 LAB — COMPREHENSIVE METABOLIC PANEL
ALT: 13 U/L (ref 9–46)
AST: 16 U/L (ref 10–35)
Albumin: 4.4 g/dL (ref 3.6–5.1)
Alkaline Phosphatase: 65 U/L (ref 40–115)
BUN: 11 mg/dL (ref 7–25)
CHLORIDE: 103 mmol/L (ref 98–110)
CO2: 26 mmol/L (ref 20–31)
CREATININE: 0.74 mg/dL (ref 0.70–1.33)
Calcium: 9.2 mg/dL (ref 8.6–10.3)
Glucose, Bld: 82 mg/dL (ref 65–99)
Potassium: 3.8 mmol/L (ref 3.5–5.3)
SODIUM: 139 mmol/L (ref 135–146)
TOTAL PROTEIN: 6.9 g/dL (ref 6.1–8.1)
Total Bilirubin: 0.6 mg/dL (ref 0.2–1.2)

## 2015-03-05 LAB — TSH: TSH: 2.024 u[IU]/mL (ref 0.350–4.500)

## 2015-03-05 LAB — LIPID PANEL
Cholesterol: 173 mg/dL (ref 125–200)
HDL: 31 mg/dL — AB (ref >=40)
LDL CALC: 103 mg/dL (ref <130)
Total CHOL/HDL Ratio: 5.6 Ratio — ABNORMAL HIGH (ref <=5.0)
Triglycerides: 193 mg/dL — ABNORMAL HIGH (ref <150)
VLDL: 39 mg/dL — ABNORMAL HIGH (ref <30)

## 2015-03-05 MED ORDER — AMLODIPINE BESYLATE 5 MG PO TABS: 5.0000 mg | ORAL_TABLET | Freq: Every day | ORAL | Status: DC

## 2015-03-05 NOTE — Progress Notes (Signed)
MRN: [id number]  Subjective:   Mr. Nathaniel Mosley is a 55 y.o. male presenting for annual physical exam and HTN.  Medical care team includes: PCP: Robyn Haber, MD Eye Care: Wears eye glasses, eye exams are done at [facility], [facility]. Dental: [facility] for dental cleanings every 6 months. Specialists:  GI - Colonoscopy done 2014, was normal, f/u in 5 years.  Patient is single, lives on his own. He does not have any children. Denies smoking cigarettes or alcohol use. Works in Par[employer]. Tries to exercise regularly but has not been as active with this in the past 6 months due to work.  Chest ache - reports ~6 month history of intermittent chest ache over midsternum. Chest ache comes on randomly, last ~3 hours. Diet is not necessarily healthy. 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 Of note, patient states that his chest occurred 1-2 times weekly but has improved in the past month. Denies diaphoresis, neck pain, jaw pain, n/v, abdominal pain, limb pain, heart racing, palpitations. Denies history of heart disease. Denies family history of MI, heart disease.   Back pain - reports ~2 day history of back pain. He moved to a new house this weekend and generally has had an achy back over the past 6 months doing work as above. He denies shooting pain in his legs, saddles parethesias, incontinence, trauma. Denies history of back injuries, surgeries. He is taking Alleve with some relief.  Edrian has HTN (hypertension); Seasonal allergies; and Kidney stone on his problem list.  Daltin has a current medication list which includes the following prescription(s): amlodipine, cetirizine, fluticasone, ketoconazole, naproxen sodium, and acetaminophen. He is allergic to azithromycin and erythromycin.  Jayten  has a past medical history of  Hypertension; Allergy; and Kidney stone on left side. Also  has past surgical history that includes Plantar fascia release (2013) and Open repair periarticular fracture / dislocation elbow (1998).  His family history includes Cancer in his mother; Diabetes in his father.  Immunizations: Flu vaccine 11/2014, last TDAP 2012  Review of Systems  Constitutional: Negative for fever, chills, weight loss, malaise/fatigue and diaphoresis.  HENT: Negative for congestion, ear discharge, ear pain, hearing loss, nosebleeds, sore throat and tinnitus.   Eyes: Negative for blurred vision, double vision, photophobia, pain, discharge and redness.  Respiratory: Negative for cough, shortness of breath and wheezing.   Cardiovascular: Positive for chest pain. Negative for palpitations and leg swelling.  Gastrointestinal: Negative for nausea, vomiting, abdominal pain, diarrhea, constipation and blood in stool.  Genitourinary: Negative for dysuria, urgency, frequency, hematuria and flank pain.  Musculoskeletal: Positive for back pain. Negative for myalgias and joint pain.  Skin: Negative for itching and rash.  Neurological: Negative for dizziness, tingling, seizures, loss of consciousness, weakness and headaches.  Endo/Heme/Allergies: Negative for polydipsia.  Psychiatric/Behavioral: Negative for depression, suicidal ideas, hallucinations, memory loss and substance abuse. The patient is not nervous/anxious and does not have insomnia.      Objective:   Vitals: BP 120/90 mmHg  Pulse 65  Temp(Src) 98.2 F (36.8 C) (Oral)  Resp 16  Ht 5' 5.5" (1.664 m)  Wt 213 lb 12.8 oz (96.979 kg)  BMI 35.02 kg/m2  SpO2 97%  Physical Exam  Constitutional: He is oriented to person, place, and time. He appears well-developed and well-nourished.  HENT:  TM's intact bilaterally, no effusions or erythema.  Nasal turbinates pink and moist, nasal passages patent. No sinus tenderness. Oropharynx clear, mucous membranes moist,  dentition in good repair.  Eyes: Conjunctivae and EOM are normal. Pupils are equal, round, and reactive to light. Right eye exhibits no discharge. Left eye exhibits no discharge. No scleral icterus.  Neck: Normal range of motion. Neck supple. No thyromegaly present.  Cardiovascular: Normal rate, regular rhythm and intact distal pulses.  Exam reveals no gallop and no friction rub.   No murmur heard. Pulmonary/Chest: No stridor. No respiratory distress. He has no wheezes. He has no rales.  Abdominal: Soft. Bowel sounds are normal. He exhibits no distension and no mass. There is no tenderness. A hernia (small reducible umbilical hernia) is present.  Genitourinary:  Deferred due to patient preference.  Musculoskeletal: Normal range of motion. He exhibits no edema or tenderness.  Lymphadenopathy:    He has no cervical adenopathy.  Neurological: He is alert and oriented to person, place, and time. He has normal reflexes. No cranial nerve deficit. Coordination normal.  Skin: Skin is warm and dry. No rash noted. No erythema. No pallor.  Psychiatric: He has a normal mood and affect.   Assessment and Plan :   1. Annual physical exam - Labs pending, patient is medically stable - Discussed healthy lifestyle, diet, exercise, preventative care, vaccinations, and addressed patient's concerns.   2. Essential hypertension - Controlled, refill amlodipine. - Recheck in 6 months.  3. Low back pain without sciatica, unspecified back pain laterality - Likely due to overuse, counseled on back care. Offered meloxicam and Flexeril which the patient declined.  4. Atypical chest pain - Offered referral to cardiology for screening heart disease. Patient prefers to wait on this.  5. Obesity - Counseled on dietary modifications. Recommended starting exercise routine again.  Jaynee Eagles, PA-C [facility] [phone] 03/05/2015  1:44 PM

## 2015-03-05 NOTE — Patient Instructions (Signed)
Keeping you healthy  Get these tests  Blood pressure- Have your blood pressure checked once a year by your healthcare provider.  Normal blood pressure is 120/80  Weight- Have your body mass index (BMI) calculated to screen for obesity.  BMI is a measure of body fat based on height and weight. You can also calculate your own BMI at [email or web address].[email or web address].  Cholesterol- Have your cholesterol checked every year.  Diabetes- Have your blood sugar checked regularly if you have high blood pressure, high cholesterol, have a family history of diabetes or if you are overweight.  Screening for Colon Cancer- Colonoscopy starting at age 25.  Screening may begin sooner depending on your family history and other health conditions. Follow up colonoscopy as directed by your Gastroenterologist.  Screening for Prostate Cancer- Both blood work (PSA) and a rectal exam help screen for Prostate Cancer.  Screening begins at age 55 with African-American men and at age 54 with Caucasian men.  Screening may begin sooner depending on your family history.  Take these medicines  Aspirin- One aspirin daily can help prevent Heart disease and Stroke.  Flu shot- Every fall.  Tetanus- Every 10 years.  Zostavax- Once after the age of 59 to prevent Shingles.  Pneumonia shot- Once after the age of 29; if you are younger than 12, ask your healthcare provider if you need a Pneumonia shot.  Take these steps  Don't smoke- If you do smoke, talk to your doctor about quitting.  For tips on how to quit, go to www.smokefree.gov or call 1-800-QUIT-NOW.  Be physically active- Exercise 5 days a week for at least 30 minutes.  If you are not already physically active start slow and gradually work up to 30 minutes of moderate physical activity.  Examples of moderate activity include walking briskly, mowing the yard, dancing, swimming, bicycling, etc.  Eat a healthy diet- Eat a variety of healthy food such as fruits, vegetables, low  fat milk, low fat cheese, yogurt, lean meant, poultry, fish, beans, tofu, etc. For more information go to www.thenutritionsource.org  Drink alcohol in moderation- Limit alcohol intake to less than two drinks a day. Never drink and drive.  Dentist- Brush and floss twice daily; visit your dentist twice a year.  Depression- Your emotional health is as important as your physical health. If you're feeling down, or losing interest in things you would normally enjoy please talk to your healthcare provider.  Eye exam- Visit your eye doctor every year.  Safe sex- If you may be exposed to a sexually transmitted infection, use a condom.  Seat belts- Seat belts can save your life; always wear one.  Smoke/Carbon Monoxide detectors- These detectors need to be installed on the appropriate level of your home.  Replace batteries at least once a year.  Skin cancer- When out in the sun, cover up and use sunscreen 15 SPF or higher.  Violence- If anyone is threatening you, please tell your healthcare provider.  Living Will/ Health care power of attorney- Speak with your healthcare provider and family.   Hypertension Hypertension, commonly called high blood pressure, is when the force of blood pumping through your arteries is too strong. Your arteries are the blood vessels that carry blood from your heart throughout your body. A blood pressure reading consists of a higher number over a lower number, such as 110/72. The higher number (systolic) is the pressure inside your arteries when your heart pumps. The lower number (diastolic) is the pressure inside your arteries when your  heart relaxes. Ideally you want your blood pressure below 120/80. Hypertension forces your heart to work harder to pump blood. Your arteries may become narrow or stiff. Having untreated or uncontrolled hypertension can cause heart attack, stroke, kidney disease, and other problems. RISK FACTORS Some risk factors for high blood pressure  are controllable. Others are not.  Risk factors you cannot control include:   Race. You may be at higher risk if you are African American.  Age. Risk increases with age.  Gender. Men are at higher risk than women before age 61 years. After age 47, women are at higher risk than men. Risk factors you can control include:  Not getting enough exercise or physical activity.  Being overweight.  Getting too much fat, sugar, calories, or salt in your diet.  Drinking too much alcohol. SIGNS AND SYMPTOMS Hypertension does not usually cause signs or symptoms. Extremely high blood pressure (hypertensive crisis) may cause headache, anxiety, shortness of breath, and nosebleed. DIAGNOSIS To check if you have hypertension, your health care provider will measure your blood pressure while you are seated, with your arm held at the level of your heart. It should be measured at least twice using the same arm. Certain conditions can cause a difference in blood pressure between your right and left arms. A blood pressure reading that is higher than normal on one occasion does not mean that you need treatment. If it is not clear whether you have high blood pressure, you may be asked to return on a different day to have your blood pressure checked again. Or, you may be asked to monitor your blood pressure at home for 1 or more weeks. TREATMENT Treating high blood pressure includes making lifestyle changes and possibly taking medicine. Living a healthy lifestyle can help lower high blood pressure. You may need to change some of your habits. Lifestyle changes may include:  Following the DASH diet. This diet is high in fruits, vegetables, and whole grains. It is low in salt, red meat, and added sugars.  Keep your sodium intake below 2,300 mg per day.  Getting at least 30-45 minutes of aerobic exercise at least 4 times per week.  Losing weight if necessary.  Not smoking.  Limiting alcoholic  beverages.  Learning ways to reduce stress. Your health care provider may prescribe medicine if lifestyle changes are not enough to get your blood pressure under control, and if one of the following is true:  You are 4-38 years of age and your systolic blood pressure is above 140.  You are 28 years of age or older, and your systolic blood pressure is above 150.  Your diastolic blood pressure is above 90.  You have diabetes, and your systolic blood pressure is over [id number] or your diastolic blood pressure is over 90.  You have kidney disease and your blood pressure is above 140/90.  You have heart disease and your blood pressure is above 140/90. Your personal target blood pressure may vary depending on your medical conditions, your age, and other factors. HOME CARE INSTRUCTIONS  Have your blood pressure rechecked as directed by your health care provider.   Take medicines only as directed by your health care provider. Follow the directions carefully. Blood pressure medicines must be taken as prescribed. The medicine does not work as well when you skip doses. Skipping doses also puts you at risk for problems.  Do not smoke.   Monitor your blood pressure at home as directed by your health care provider.  SEEK MEDICAL CARE IF:   You think you are having a reaction to medicines taken.  You have recurrent headaches or feel dizzy.  You have swelling in your ankles.  You have trouble with your vision. SEEK IMMEDIATE MEDICAL CARE IF:  You develop a severe headache or confusion.  You have unusual weakness, numbness, or feel faint.  You have severe chest or abdominal pain.  You vomit repeatedly.  You have trouble breathing. MAKE SURE YOU:   Understand these instructions.  Will watch your condition.  Will get help right away if you are not doing well or get worse.   This information is not intended to replace advice given to you by your health care provider. Make sure you  discuss any questions you have with your health care provider.   Document Released: 02/14/2005 Document Revised: 07/01/2014 Document Reviewed: 12/07/2012 Elsevier Interactive Patient Education 2016 Elsevier Inc.   Back Pain, Adult Back pain is very common in adults.The cause of back pain is rarely dangerous and the pain often gets better over time.The cause of your back pain may not be known. Some common causes of back pain include:  Strain of the muscles or ligaments supporting the spine.  Wear and tear (degeneration) of the spinal disks.  Arthritis.  Direct injury to the back. For many people, back pain may return. Since back pain is rarely dangerous, most people can learn to manage this condition on their own. HOME CARE INSTRUCTIONS Watch your back pain for any changes. The following actions may help to lessen any discomfort you are feeling:  Remain active. It is stressful on your back to sit or stand in one place for long periods of time. Do not sit, drive, or stand in one place for more than 30 minutes at a time. Take short walks on even surfaces as soon as you are able.Try to increase the length of time you walk each day.  Exercise regularly as directed by your health care provider. Exercise helps your back heal faster. It also helps avoid future injury by keeping your muscles strong and flexible.  Do not stay in bed.Resting more than 1-2 days can delay your recovery.  Pay attention to your body when you bend and lift. The most comfortable positions are those that put less stress on your recovering back. Always use proper lifting techniques, including:  Bending your knees.  Keeping the load close to your body.  Avoiding twisting.  Find a comfortable position to sleep. Use a firm mattress and lie on your side with your knees slightly bent. If you lie on your back, put a pillow under your knees.  Avoid feeling anxious or stressed.Stress increases muscle tension and can  worsen back pain.It is important to recognize when you are anxious or stressed and learn ways to manage it, such as with exercise.  Take medicines only as directed by your health care provider. Over-the-counter medicines to reduce pain and inflammation are often the most helpful.Your health care provider may prescribe muscle relaxant drugs.These medicines help dull your pain so you can more quickly return to your normal activities and healthy exercise.  Apply ice to the injured area:  Put ice in a plastic bag.  Place a towel between your skin and the bag.  Leave the ice on for 20 minutes, 2-3 times a day for the first 2-3 days. After that, ice and heat may be alternated to reduce pain and spasms.  Maintain a healthy weight. Excess weight puts extra  stress on your back and makes it difficult to maintain good posture. SEEK MEDICAL CARE IF:  You have pain that is not relieved with rest or medicine.  You have increasing pain going down into the legs or buttocks.  You have pain that does not improve in one week.  You have night pain.  You lose weight.  You have a fever or chills. SEEK IMMEDIATE MEDICAL CARE IF:   You develop new bowel or bladder control problems.  You have unusual weakness or numbness in your arms or legs.  You develop nausea or vomiting.  You develop abdominal pain.  You feel faint.   This information is not intended to replace advice given to you by your health care provider. Make sure you discuss any questions you have with your health care provider.   Document Released: 02/14/2005 Document Revised: 03/07/2014 Document Reviewed: 06/18/2013 Elsevier Interactive Patient Education 2016 Elsevier Inc.   Nonspecific Chest Pain  Chest pain can be caused by many different conditions. There is always a chance that your pain could be related to something serious, such as a heart attack or a blood clot in your lungs. Chest pain can also be caused by conditions  that are not life-threatening. If you have chest pain, it is very important to follow up with your health care provider. CAUSES  Chest pain can be caused by:  Heartburn.  Pneumonia or bronchitis.  Anxiety or stress.  Inflammation around your heart (pericarditis) or lung (pleuritis or pleurisy).  A blood clot in your lung.  A collapsed lung (pneumothorax). It can develop suddenly on its own (spontaneous pneumothorax) or from trauma to the chest.  Shingles infection (varicella-zoster virus).  Heart attack.  Damage to the bones, muscles, and cartilage that make up your chest wall. This can include:  Bruised bones due to injury.  Strained muscles or cartilage due to frequent or repeated coughing or overwork.  Fracture to one or more ribs.  Sore cartilage due to inflammation (costochondritis). RISK FACTORS  Risk factors for chest pain may include:  Activities that increase your risk for trauma or injury to your chest.  Respiratory infections or conditions that cause frequent coughing.  Medical conditions or overeating that can cause heartburn.  Heart disease or family history of heart disease.  Conditions or health behaviors that increase your risk of developing a blood clot.  Having had chicken pox (varicella zoster). SIGNS AND SYMPTOMS Chest pain can feel like:  Burning or tingling on the surface of your chest or deep in your chest.  Crushing, pressure, aching, or squeezing pain.  Dull or sharp pain that is worse when you move, cough, or take a deep breath.  Pain that is also felt in your back, neck, shoulder, or arm, or pain that spreads to any of these areas. Your chest pain may come and go, or it may stay constant. DIAGNOSIS Lab tests or other studies may be needed to find the cause of your pain. Your health care provider may have you take a test called an ambulatory ECG (electrocardiogram). An ECG records your heartbeat patterns at the time the test is  performed. You may also have other tests, such as:  Transthoracic echocardiogram (TTE). During echocardiography, sound waves are used to create a picture of all of the heart structures and to look at how blood flows through your heart.  Transesophageal echocardiogram (TEE).This is a more advanced imaging test that obtains images from inside your body. It allows your health care  provider to see your heart in finer detail.  Cardiac monitoring. This allows your health care provider to monitor your heart rate and rhythm in real time.  Holter monitor. This is a portable device that records your heartbeat and can help to diagnose abnormal heartbeats. It allows your health care provider to track your heart activity for several days, if needed.  Stress tests. These can be done through exercise or by taking medicine that makes your heart beat more quickly.  Blood tests.  Imaging tests. TREATMENT  Your treatment depends on what is causing your chest pain. Treatment may include:  Medicines. These may include:  Acid blockers for heartburn.  Anti-inflammatory medicine.  Pain medicine for inflammatory conditions.  Antibiotic medicine, if an infection is present.  Medicines to dissolve blood clots.  Medicines to treat coronary artery disease.  Supportive care for conditions that do not require medicines. This may include:  Resting.  Applying heat or cold packs to injured areas.  Limiting activities until pain decreases. HOME CARE INSTRUCTIONS  If you were prescribed an antibiotic medicine, finish it all even if you start to feel better.  Avoid any activities that bring on chest pain.  Do not use any tobacco products, including cigarettes, chewing tobacco, or electronic cigarettes. If you need help quitting, ask your health care provider.  Do not drink alcohol.  Take medicines only as directed by your health care provider.  Keep all follow-up visits as directed by your health care  provider. This is important. This includes any further testing if your chest pain does not go away.  If heartburn is the cause for your chest pain, you may be told to keep your head raised (elevated) while sleeping. This reduces the chance that acid will go from your stomach into your esophagus.  Make lifestyle changes as directed by your health care provider. These may include:  Getting regular exercise. Ask your health care provider to suggest some activities that are safe for you.  Eating a heart-healthy diet. A registered dietitian can help you to learn healthy eating options.  Maintaining a healthy weight.  Managing diabetes, if necessary.  Reducing stress. SEEK MEDICAL CARE IF:  Your chest pain does not go away after treatment.  You have a rash with blisters on your chest.  You have a fever. SEEK IMMEDIATE MEDICAL CARE IF:   Your chest pain is worse.  You have an increasing cough, or you cough up blood.  You have severe abdominal pain.  You have severe weakness.  You faint.  You have chills.  You have sudden, unexplained chest discomfort.  You have sudden, unexplained discomfort in your arms, back, neck, or jaw.  You have shortness of breath at any time.  You suddenly start to sweat, or your skin gets clammy.  You feel nauseous or you vomit.  You suddenly feel light-headed or dizzy.  Your heart begins to beat quickly, or it feels like it is skipping beats. These symptoms may represent a serious problem that is an emergency. Do not wait to see if the symptoms will go away. Get medical help right away. Call your local emergency services (911 in the [place] Do not drive yourself to the hospital.   This information is not intended to replace advice given to you by your health care provider. Make sure you discuss any questions you have with your health care provider.   Document Released: 11/24/2004 Document Revised: 03/07/2014 Document Reviewed:  09/20/2013 Elsevier Interactive Patient Education 2016 Elsevier  Inc.  

## 2015-03-11 ENCOUNTER — Encounter: Payer: Self-pay | Admitting: Urgent Care

## 2015-04-29 ENCOUNTER — Other Ambulatory Visit: Payer: Self-pay | Admitting: Family Medicine

## 2015-05-28 ENCOUNTER — Other Ambulatory Visit: Payer: Self-pay | Admitting: Internal Medicine

## 2015-09-02 NOTE — Progress Notes (Signed)
Subjective:    Patient ID: Nathaniel Mosley, male    DOB: Jan 20, 1961, 55 y.o.   MRN: [id number] Chief Complaint  Patient presents with  . Follow-up    BLOOD PRESSURE    HPI  Nathaniel Mosley is a 55 yo male here today for recheck of HTN.   HTN: Has been well-controlled on amlodipine 5 mg.  At his CPE 6 mos prior he reported 6 mos of atypical CP but declined cardiology referral.  It was intermittent lasting approx 3 hrs and occuring 1-2/xwk w/o asstd sxs or exacerbating factors.  He realized this was indigestion/GERD from to much aleve for functional LBP and so CP has completely resolved as long as no nsaids or spicy food.  He used some otc nexium over the winter which helped.  He has a BP cuff at home but has not used it.  6 mos prior LDL was 103 with non-HDL of 142. Not on chol med.   Left elbow tendenitis with prn tylenol for 3-4 mos  - wakes him up from sleep  Past Medical History  Diagnosis Date  . Hypertension   . Allergy   . Kidney stone on left side    Past Surgical History  Procedure Laterality Date  . Plantar fascia release  2013  . Open repair periarticular fracture / dislocation elbow  1998   Current Outpatient Prescriptions on File Prior to Visit  Medication Sig Dispense Refill  . amLODipine (NORVASC) 5 MG tablet Take 1 tablet (5 mg total) by mouth daily. 90 tablet 3  . cetirizine (ZYRTEC) 10 MG tablet Take 10 mg by mouth daily.    . fluticasone (FLONASE) 50 MCG/ACT nasal spray USE 2 SPRAYS IN EACH NOSTRIL TWICE A DAY. OFFICE VISIT NEEDED FOR REFILLS" 16 g 2  . ketoconazole (NIZORAL) 2 % cream APPLY TOPICALLY DAILY. 60 g 0   No current facility-administered medications on file prior to visit.   Allergies  Allergen Reactions  . Azithromycin Other (See Comments)    GI upset  . Erythromycin    Family History  Problem Relation Age of Onset  . Cancer Mother   . Diabetes Father    Social History   Social History  . Marital Status: Single    Spouse Name: N/A   . Number of Children: N/A  . Years of Education: N/A   Social History Main Topics  . Smoking status: Never Smoker   . Smokeless tobacco: None  . Alcohol Use: No  . Drug Use: No  . Sexual Activity: No   Other Topics Concern  . None   Social History Narrative    Review of Systems  Constitutional: Negative for fever and chills.  Eyes: Negative for visual disturbance.  Respiratory: Negative for chest tightness and shortness of breath.   Cardiovascular: Negative for chest pain, palpitations and leg swelling.  Musculoskeletal: Positive for myalgias and arthralgias. Negative for joint swelling, neck pain and neck stiffness.  Skin: Negative for color change, pallor and wound.  Neurological: Negative for dizziness, syncope, facial asymmetry, weakness, light-headedness and headaches.  Psychiatric/Behavioral: Positive for sleep disturbance.       Objective:  BP 130/88 mmHg  Pulse 73  Temp(Src) 98.5 F (36.9 C) (Oral)  Resp 16  Ht 5' 5.5" (1.664 m)  Wt 221 lb 3.2 oz (100.336 kg)  BMI 36.24 kg/m2  SpO2 96%  Physical Exam  Constitutional: He is oriented to person, place, and time. He appears well-developed and well-nourished. No distress.  HENT:  Head: Normocephalic and atraumatic.  Eyes: Conjunctivae are normal. Pupils are equal, round, and reactive to light. No scleral icterus.  Neck: Normal range of motion. Neck supple. No thyromegaly present.  Cardiovascular: Normal rate, regular rhythm, normal heart sounds and intact distal pulses.   Pulmonary/Chest: Effort normal and breath sounds normal. No respiratory distress.  Musculoskeletal: He exhibits no edema.  Lymphadenopathy:    He has no cervical adenopathy.  Neurological: He is alert and oriented to person, place, and time.  Skin: Skin is warm and dry. He is not diaphoretic.  Psychiatric: He has a normal mood and affect. His behavior is normal.   Recheck manual sitting by myself shows left arm bp 140/98 and right arm  120/90    Assessment & Plan:   RTC in 6 mos for CPE - will do flp and ekg at that time.  1. Essential hypertension - Monitor bp outside of the office - if cont to be borderline high - would rec adding in hctz 12.5. Cont amlodipine 5  2. Lateral epicondylitis (tennis elbow), left - placed in band a to wear continuously and start home exercises. If pain persists after 4-6 wks or worsens at all, call for referral to sports med.  3.      HPL - ASCVD 6% with baseline of 3%.  Start fish oil.  Orders Placed This Encounter  Procedures  . Care order/instruction    AVS printed - let patient go! After he is fitted with a tennis elbow band. sched CPE for 6 mos with fasting labs    Meds ordered this encounter  Medications  . Acetaminophen (TYLENOL PO)    Sig: Take by mouth as needed.    Delman Cheadle, M.D.  [facility] [address] [place], [place] [address] [phone] phone [phone] fax  09/14/2015 1:04 PM

## 2015-09-03 ENCOUNTER — Encounter: Payer: Self-pay | Admitting: Family Medicine

## 2015-09-03 ENCOUNTER — Ambulatory Visit (INDEPENDENT_AMBULATORY_CARE_PROVIDER_SITE_OTHER): Payer: Commercial Managed Care - HMO | Admitting: Family Medicine

## 2015-09-03 ENCOUNTER — Ambulatory Visit: Payer: Commercial Managed Care - HMO | Admitting: Urgent Care

## 2015-09-03 VITALS — BP 130/88 | HR 73 | Temp 98.5°F | Resp 16 | Ht 65.5 in | Wt 221.2 lb

## 2015-09-03 DIAGNOSIS — E785 Hyperlipidemia, unspecified: Secondary | ICD-10-CM

## 2015-09-03 DIAGNOSIS — M7712 Lateral epicondylitis, left elbow: Secondary | ICD-10-CM

## 2015-09-03 DIAGNOSIS — I10 Essential (primary) hypertension: Secondary | ICD-10-CM

## 2015-09-03 NOTE — Patient Instructions (Addendum)
Managing Your High Blood Pressure Blood pressure is a measurement of how forceful your blood is pressing against the walls of the arteries. Arteries are muscular tubes within the circulatory system. Blood pressure does not stay the same. Blood pressure rises when you are active, excited, or nervous; and it lowers during sleep and relaxation. If the numbers measuring your blood pressure stay above normal most of the time, you are at risk for health problems. High blood pressure (hypertension) is a long-term (chronic) condition in which blood pressure is elevated. A blood pressure reading is recorded as two numbers, such as 120 over 80 (or 120/80). The first, higher number is called the systolic pressure. It is a measure of the pressure in your arteries as the heart beats. The second, lower number is called the diastolic pressure. It is a measure of the pressure in your arteries as the heart relaxes between beats.  Keeping your blood pressure in a normal range is important to your overall health and prevention of health problems, such as heart disease and stroke. When your blood pressure is uncontrolled, your heart has to work harder than normal. High blood pressure is a very common condition in adults because blood pressure tends to rise with age. Men and women are equally likely to have hypertension but at different times in life. Before age 8, men are more likely to have hypertension. After 54 years of age, women are more likely to have it. Hypertension is especially common in African Americans. This condition often has no signs or symptoms. The cause of the condition is usually not known. Your caregiver can help you come up with a plan to keep your blood pressure in a normal, healthy range. BLOOD PRESSURE STAGES Blood pressure is classified into four stages: normal, prehypertension, stage 1, and stage 2. Your blood pressure reading will be used to determine what type of treatment, if any, is necessary.  Appropriate treatment options are tied to these four stages:  Normal  Systolic pressure (mm Hg): below 120.  Diastolic pressure (mm Hg): below 80. Prehypertension  Systolic pressure (mm Hg): 120 to 139.  Diastolic pressure (mm Hg): 80 to 89. Stage1  Systolic pressure (mm Hg): 140 to 159.  Diastolic pressure (mm Hg): 90 to 99. Stage2  Systolic pressure (mm Hg): 160 or above.  Diastolic pressure (mm Hg): 100 or above. RISKS RELATED TO HIGH BLOOD PRESSURE Managing your blood pressure is an important responsibility. Uncontrolled high blood pressure can lead to:  A heart attack.  A stroke.  A weakened blood vessel (aneurysm).  Heart failure.  Kidney damage.  Eye damage.  Metabolic syndrome.  Memory and concentration problems. HOW TO MANAGE YOUR BLOOD PRESSURE Blood pressure can be managed effectively with lifestyle changes and medicines (if needed). Your caregiver will help you come up with a plan to bring your blood pressure within a normal range. Your plan should include the following: Education  Read all information provided by your caregivers about how to control blood pressure.  Educate yourself on the latest guidelines and treatment recommendations. New research is always being done to further define the risks and treatments for high blood pressure. Lifestylechanges  Control your weight.  Avoid smoking.  Stay physically active.  Reduce the amount of salt in your diet.  Reduce stress.  Control any chronic conditions, such as high cholesterol or diabetes.  Reduce your alcohol intake. Medicines  Several medicines (antihypertensive medicines) are available, if needed, to bring blood pressure within a normal  range. Communication  Review all the medicines you take with your caregiver because there may be side effects or interactions.  Talk with your caregiver about your diet, exercise habits, and other lifestyle factors that may be contributing to  high blood pressure.  See your caregiver regularly. Your caregiver can help you create and adjust your plan for managing high blood pressure. RECOMMENDATIONS FOR TREATMENT AND FOLLOW-UP  The following recommendations are based on current guidelines for managing high blood pressure in nonpregnant adults. Use these recommendations to identify the proper follow-up period or treatment option based on your blood pressure reading. You can discuss these options with your caregiver.  Systolic pressure of 123456 to [id number] or diastolic pressure of 80 to 89: Follow up with your caregiver as directed.  Systolic pressure of [id number] to 0000000 or diastolic pressure of 90 to 100: Follow up with your caregiver within 2 months.  Systolic pressure above 0000000 or diastolic pressure above [id number]: Follow up with your caregiver within 1 month.  Systolic pressure above [id number] or diastolic pressure above [id number]: Consider antihypertensive therapy; follow up with your caregiver within 1 week.  Systolic pressure above [id number] or diastolic pressure above 123456: Begin antihypertensive therapy; follow up with your caregiver within 1 week.   This information is not intended to replace advice given to you by your health care provider. Make sure you discuss any questions you have with your health care provider.   Document Released: 11/09/2011 Document Reviewed: 11/09/2011 Elsevier Interactive Patient Education [employer].     Why follow it? Research shows. . Those who follow the Mediterranean diet have a reduced risk of heart disease  . The diet is associated with a reduced incidence of Parkinson's and Alzheimer's diseases . People following the diet may have longer life expectancies and lower rates of chronic diseases  . The Dietary Guidelines for Americans recommends the Mediterranean diet as an eating plan to promote health and prevent disease  What Is the Mediterranean Diet?  . Healthy eating plan based on typical foods and recipes of  Mediterranean-style cooking . The diet is primarily a plant based diet; these foods should make up a majority of meals   Starches - Plant based foods should make up a majority of meals - They are an important sources of vitamins, minerals, energy, antioxidants, and fiber - Choose whole grains, foods high in fiber and minimally processed items  - Typical grain sources include wheat, oats, barley, corn, brown rice, bulgar, farro, millet, polenta, couscous  - Various types of beans include chickpeas, lentils, fava beans, black beans, white beans   Fruits  Veggies - Large quantities of antioxidant rich fruits & veggies; 6 or more servings  - Vegetables can be eaten raw or lightly drizzled with oil and cooked  - Vegetables common to the traditional Mediterranean Diet include: artichokes, arugula, beets, broccoli, brussel sprouts, cabbage, carrots, celery, collard greens, cucumbers, eggplant, kale, leeks, lemons, lettuce, mushrooms, okra, onions, peas, peppers, potatoes, pumpkin, radishes, rutabaga, shallots, spinach, sweet potatoes, turnips, zucchini - Fruits common to the Mediterranean Diet include: apples, apricots, avocados, cherries, clementines, dates, figs, grapefruits, grapes, melons, nectarines, oranges, peaches, pears, pomegranates, strawberries, tangerines  Fats - Replace butter and margarine with healthy oils, such as olive oil, canola oil, and tahini  - Limit nuts to no more than a handful a day  - Nuts include walnuts, almonds, pecans, pistachios, pine nuts  - Limit or avoid candied, honey roasted or heavily salted nuts - Olives are central  to the [place] diet - can be eaten whole or used in a variety of dishes   Meats Protein - Limiting red meat: no more than a few times a month - When eating red meat: choose lean cuts and keep the portion to the size of deck of cards - Eggs: approx. 0 to 4 times a week  - Fish and lean poultry: at least 2 a week  - Healthy protein sources  include, chicken, [place], lean beef, lamb - Increase intake of seafood such as tuna, salmon, trout, mackerel, shrimp, scallops - Avoid or limit high fat processed meats such as sausage and bacon  Dairy - Include moderate amounts of low fat dairy products  - Focus on healthy dairy such as fat free yogurt, skim milk, low or reduced fat cheese - Limit dairy products higher in fat such as whole or 2% milk, cheese, ice cream  Alcohol - Moderate amounts of red wine is ok  - No more than 5 oz daily for women (all ages) and men older than age 47  - No more than 10 oz of wine daily for men younger than 58  Other - Limit sweets and other desserts  - Use herbs and spices instead of salt to flavor foods  - Herbs and spices common to the traditional Mediterranean Diet include: basil, bay leaves, chives, cloves, cumin, fennel, garlic, lavender, marjoram, mint, oregano, parsley, pepper, rosemary, sage, savory, sumac, tarragon, thyme   It's not just a diet, it's a lifestyle:  . The Mediterranean diet includes lifestyle factors typical of those in the region  . Foods, drinks and meals are best eaten with others and savored . Daily physical activity is important for overall good health . This could be strenuous exercise like running and aerobics . This could also be more leisurely activities such as walking, housework, yard-work, or taking the stairs . Moderation is the key; a balanced and healthy diet accommodates most foods and drinks . Consider portion sizes and frequency of consumption of certain foods   Meal Ideas & Options:  . Breakfast:  o Whole wheat toast or whole wheat English muffins with peanut butter & hard boiled egg o Steel cut oats topped with apples & cinnamon and skim milk  o Fresh fruit: banana, strawberries, melon, berries, peaches  o Smoothies: strawberries, bananas, greek yogurt, peanut butter o Low fat greek yogurt with blueberries and granola  o Egg white omelet with spinach and  mushrooms o Breakfast couscous: whole wheat couscous, apricots, skim milk, cranberries  . Sandwiches:  o Hummus and grilled vegetables (peppers, zucchini, squash) on whole wheat bread   o Grilled chicken on whole wheat pita with lettuce, tomatoes, cucumbers or tzatziki  o Tuna salad on whole wheat bread: tuna salad made with greek yogurt, olives, red peppers, capers, green onions o Garlic rosemary lamb pita: lamb sauted with garlic, rosemary, salt & pepper; add lettuce, cucumber, greek yogurt to pita - flavor with lemon juice and black pepper  . Seafood:  o Mediterranean grilled salmon, seasoned with garlic, basil, parsley, lemon juice and black pepper o Shrimp, lemon, and spinach whole-grain pasta salad made with low fat greek yogurt  o Seared scallops with lemon orzo  o Seared tuna steaks seasoned salt, pepper, coriander topped with tomato mixture of olives, tomatoes, olive oil, minced garlic, parsley, green onions and cappers  . Meats:  o Herbed greek chicken salad with kalamata olives, cucumber, feta  o Red bell peppers stuffed with spinach, bulgur,  lean ground beef (or lentils) & topped with feta   o Kebabs: skewers of chicken, tomatoes, onions, zucchini, squash  o [place] burgers: made with red onions, mint, dill, lemon juice, feta cheese topped with roasted red peppers . Vegetarian o Cucumber salad: cucumbers, artichoke hearts, celery, red onion, feta cheese, tossed in olive oil & lemon juice  o Hummus and whole grain pita points with a greek salad (lettuce, tomato, feta, olives, cucumbers, red onion) o Lentil soup with celery, carrots made with vegetable broth, garlic, salt and pepper  o Tabouli salad: parsley, bulgur, mint, scallions, cucumbers, tomato, radishes, lemon juice, olive oil, salt and pepper.         IF you received an x-ray today, you will receive an invoice from [facility]. Please contact [facility] at [phone] with questions or concerns  regarding your invoice.   IF you received labwork today, you will receive an invoice from [employer]. Please contact [facility] at [phone] with questions or concerns regarding your invoice.   Our billing staff will not be able to assist you with questions regarding bills from these companies.  You will be contacted with the lab results as soon as they are available. The fastest way to get your results is to activate your My Chart account. Instructions are located on the last page of this paperwork. If you have not heard from [place] regarding the results in 2 weeks, please contact this office.    Lateral Epicondylitis With Rehab Lateral epicondylitis involves inflammation and pain around the outer portion of the elbow. The pain is caused by inflammation of the tendons in the forearm that bring back (extend) the wrist. Lateral epicondylitis is also called tennis elbow, because it is very common in tennis players. However, it may occur in any individual who extends the wrist repetitively. If lateral epicondylitis is left untreated, it may become a chronic problem. SYMPTOMS   Pain, tenderness, and inflammation on the outer (lateral) side of the elbow.  Pain or weakness with gripping activities.  Pain that increases with wrist-twisting motions (playing tennis, using a screwdriver, opening a door or a jar).  Pain with lifting objects, including a coffee cup. CAUSES  Lateral epicondylitis is caused by inflammation of the tendons that extend the wrist. Causes of injury may include:  Repetitive stress and strain on the muscles and tendons that extend the wrist.  Sudden change in activity level or intensity.  Incorrect grip in racquet sports.  Incorrect grip size of racquet (often too large).  Incorrect hitting position or technique (usually backhand, leading with the elbow).  Using a racket that is too heavy. RISK INCREASES WITH:  Sports or occupations that  require repetitive and/or strenuous forearm and wrist movements (tennis, squash, racquetball, carpentry).  Poor wrist and forearm strength and flexibility.  Failure to warm up properly before activity.  Resuming activity before healing, rehabilitation, and conditioning are complete. PREVENTION   Warm up and stretch properly before activity.  Maintain physical fitness:  Strength, flexibility, and endurance.  Cardiovascular fitness.  Wear and use properly fitted equipment.  Learn and use proper technique and have a coach correct improper technique.  Wear a tennis elbow (counterforce) brace. PROGNOSIS  The course of this condition depends on the degree of the injury. If treated properly, acute cases (symptoms lasting less than 4 weeks) are often resolved in 2 to 6 weeks. Chronic (longer lasting cases) often resolve in 3 to 6 months but may require physical therapy. RELATED COMPLICATIONS  Frequently recurring symptoms, resulting in a chronic problem. Properly treating the problem the first time decreases frequency of recurrence.  Chronic inflammation, scarring tendon degeneration, and partial tendon tear, requiring surgery.  Delayed healing or resolution of symptoms. TREATMENT  Treatment first involves the use of ice and medicine to reduce pain and inflammation. Strengthening and stretching exercises may help reduce discomfort if performed regularly. These exercises may be performed at home if the condition is an acute injury. Chronic cases may require a referral to a physical therapist for evaluation and treatment. Your caregiver may advise a corticosteroid injection to help reduce inflammation. Rarely, surgery is needed. MEDICATION  If pain medicine is needed, nonsteroidal anti-inflammatory medicines (aspirin and ibuprofen), or other minor pain relievers (acetaminophen), are often advised.  Do not take pain medicine for 7 days before surgery.  Prescription pain relievers may be  given, if your caregiver thinks they are needed. Use only as directed and only as much as you need.  Corticosteroid injections may be recommended. These injections should be reserved only for the most severe cases, because they can only be given a certain number of times. HEAT AND COLD  Cold treatment (icing) should be applied for 10 to 15 minutes every 2 to 3 hours for inflammation and pain, and immediately after activity that aggravates your symptoms. Use ice packs or an ice massage.  Heat treatment may be used before performing stretching and strengthening activities prescribed by your caregiver, physical therapist, or athletic trainer. Use a heat pack or a warm water soak. SEEK MEDICAL CARE IF: Symptoms get worse or do not improve in 2 weeks, despite treatment. EXERCISES  RANGE OF MOTION (ROM) AND STRETCHING EXERCISES - Epicondylitis, Lateral (Tennis Elbow) These exercises may help you when beginning to rehabilitate your injury. Your symptoms may go away with or without further involvement from your physician, physical therapist, or athletic trainer. While completing these exercises, remember:   Restoring tissue flexibility helps normal motion to return to the joints. This allows healthier, less painful movement and activity.  An effective stretch should be held for at least 30 seconds.  A stretch should never be painful. You should only feel a gentle lengthening or release in the stretched tissue. RANGE OF MOTION - Wrist Flexion, Active-Assisted  Extend your right / left elbow with your fingers pointing down.*  Gently pull the back of your hand towards you, until you feel a gentle stretch on the top of your forearm.  Hold this position for __________ seconds. Repeat __________ times. Complete this exercise __________ times per day.  *If directed by your physician, physical therapist or athletic trainer, complete this stretch with your elbow bent, rather than extended. RANGE OF MOTION  - Wrist Extension, Active-Assisted  Extend your right / left elbow and turn your palm upwards.*  Gently pull your palm and fingertips back, so your wrist extends and your fingers point more toward the ground.  You should feel a gentle stretch on the inside of your forearm.  Hold this position for __________ seconds. Repeat __________ times. Complete this exercise __________ times per day. *If directed by your physician, physical therapist or athletic trainer, complete this stretch with your elbow bent, rather than extended. STRETCH - Wrist Flexion  Place the back of your right / left hand on a tabletop, leaving your elbow slightly bent. Your fingers should point away from your body.  Gently press the back of your hand down onto the table by straightening your elbow. You should feel a  stretch on the top of your forearm.  Hold this position for __________ seconds. Repeat __________ times. Complete this stretch __________ times per day.  STRETCH - Wrist Extension   Place your right / left fingertips on a tabletop, leaving your elbow slightly bent. Your fingers should point backwards.  Gently press your fingers and palm down onto the table by straightening your elbow. You should feel a stretch on the inside of your forearm.  Hold this position for __________ seconds. Repeat __________ times. Complete this stretch __________ times per day.  STRENGTHENING EXERCISES - Epicondylitis, Lateral (Tennis Elbow) These exercises may help you when beginning to rehabilitate your injury. They may resolve your symptoms with or without further involvement from your physician, physical therapist, or athletic trainer. While completing these exercises, remember:   Muscles can gain both the endurance and the strength needed for everyday activities through controlled exercises.  Complete these exercises as instructed by your physician, physical therapist or athletic trainer. Increase the resistance and  repetitions only as guided.  You may experience muscle soreness or fatigue, but the pain or discomfort you are trying to eliminate should never worsen during these exercises. If this pain does get worse, stop and make sure you are following the directions exactly. If the pain is still present after adjustments, discontinue the exercise until you can discuss the trouble with your caregiver. STRENGTH - Wrist Flexors  Sit with your right / left forearm palm-up and fully supported on a table or countertop. Your elbow should be resting below the height of your shoulder. Allow your wrist to extend over the edge of the surface.  Loosely holding a __________ weight, or a piece of rubber exercise band or tubing, slowly curl your hand up toward your forearm.  Hold this position for __________ seconds. Slowly lower the wrist back to the starting position in a controlled manner. Repeat __________ times. Complete this exercise __________ times per day.  STRENGTH - Wrist Extensors  Sit with your right / left forearm palm-down and fully supported on a table or countertop. Your elbow should be resting below the height of your shoulder. Allow your wrist to extend over the edge of the surface.  Loosely holding a __________ weight, or a piece of rubber exercise band or tubing, slowly curl your hand up toward your forearm.  Hold this position for __________ seconds. Slowly lower the wrist back to the starting position in a controlled manner. Repeat __________ times. Complete this exercise __________ times per day.  STRENGTH - Ulnar Deviators  Stand with a ____________________ weight in your right / left hand, or sit while holding a rubber exercise band or tubing, with your healthy arm supported on a table or countertop.  Move your wrist, so that your pinkie travels toward your forearm and your thumb moves away from your forearm.  Hold this position for __________ seconds and then slowly lower the wrist back to  the starting position. Repeat __________ times. Complete this exercise __________ times per day STRENGTH - Radial Deviators  Stand with a ____________________ weight in your right / left hand, or sit while holding a rubber exercise band or tubing, with your injured arm supported on a table or countertop.  Raise your hand upward in front of you or pull up on the rubber tubing.  Hold this position for __________ seconds and then slowly lower the wrist back to the starting position. Repeat __________ times. Complete this exercise __________ times per day. STRENGTH - Forearm Supinators  Sit with your right / left forearm supported on a table, keeping your elbow below shoulder height. Rest your hand over the edge, palm down.  Gently grip a hammer or a soup ladle.  Without moving your elbow, slowly turn your palm and hand upward to a "thumbs-up" position.  Hold this position for __________ seconds. Slowly return to the starting position. Repeat __________ times. Complete this exercise __________ times per day.  STRENGTH - Forearm Pronators   Sit with your right / left forearm supported on a table, keeping your elbow below shoulder height. Rest your hand over the edge, palm up.  Gently grip a hammer or a soup ladle.  Without moving your elbow, slowly turn your palm and hand upward to a "thumbs-up" position.  Hold this position for __________ seconds. Slowly return to the starting position. Repeat __________ times. Complete this exercise __________ times per day.  STRENGTH - Grip  Grasp a tennis ball, a dense sponge, or a large, rolled sock in your hand.  Squeeze as hard as you can, without increasing any pain.  Hold this position for __________ seconds. Release your grip slowly. Repeat __________ times. Complete this exercise __________ times per day.  STRENGTH - Elbow Extensors, Isometric  Stand or sit upright, on a firm surface. Place your right / left arm so that your palm faces  your stomach, and it is at the height of your waist.  Place your opposite hand on the underside of your forearm. Gently push up as your right / left arm resists. Push as hard as you can with both arms, without causing any pain or movement at your right / left elbow. Hold this stationary position for __________ seconds. Gradually release the tension in both arms. Allow your muscles to relax completely before repeating.   This information is not intended to replace advice given to you by your health care provider. Make sure you discuss any questions you have with your health care provider.   Document Released: 02/14/2005 Document Revised: 03/07/2014 Document Reviewed: 05/29/2008 Elsevier Interactive Patient Education [employer].

## 2015-11-25 ENCOUNTER — Telehealth: Payer: Self-pay

## 2015-11-25 DIAGNOSIS — M7712 Lateral epicondylitis, left elbow: Secondary | ICD-10-CM

## 2015-11-25 NOTE — Telephone Encounter (Signed)
Pt called in wanting Dr. Brigitte Pulse to start a referral with him for his tennis elbow. He stated he has been seen for this by her before. Please advise at [phone]

## 2015-11-28 ENCOUNTER — Other Ambulatory Visit: Payer: Self-pay | Admitting: Family Medicine

## 2015-11-30 NOTE — Telephone Encounter (Signed)
Referral to sports medicine placed

## 2015-11-30 NOTE — Telephone Encounter (Signed)
At 09/03/15 ov this was discussed and told if persists could call for sports medicine referral. Please advise/place referral if appropriate

## 2015-12-09 ENCOUNTER — Ambulatory Visit: Payer: Self-pay

## 2015-12-09 ENCOUNTER — Ambulatory Visit (INDEPENDENT_AMBULATORY_CARE_PROVIDER_SITE_OTHER): Payer: Commercial Managed Care - HMO | Admitting: Sports Medicine

## 2015-12-09 ENCOUNTER — Encounter: Payer: Self-pay | Admitting: Sports Medicine

## 2015-12-09 VITALS — BP 144/101 | HR 71 | Ht 66.0 in | Wt 220.0 lb

## 2015-12-09 DIAGNOSIS — M7712 Lateral epicondylitis, left elbow: Secondary | ICD-10-CM | POA: Insufficient documentation

## 2015-12-09 DIAGNOSIS — M25522 Pain in left elbow: Secondary | ICD-10-CM

## 2015-12-09 NOTE — Progress Notes (Signed)
  Nathaniel Mosley - 55 y.o. male MRN [id number]  Date of birth: [birth year]  SUBJECTIVE:  Including CC & ROS.  Chief Complaint  Patient presents with  . Elbow Pain      Nathaniel Mosley is a 55 yo that is presenting with left lateral elbow pain. His elbow pain started about 4 months ago. He reports the pain is localized to his lateral elbow. There is no radiation down into his hand. He denies any altered sensation in his fingers. He reports he has worn a brace for a couple of months with no benefit. He takes Tylenol with improvement of his pain. He has not had any formal physical therapy or any injections into the area.  ROS: No unexpected weight loss, fever, chills, swelling, instability, numbness/tingling, redness, otherwise see HPI    HISTORY: Past Medical, Surgical, Social, and Family History Reviewed & Updated per EMR.   Pertinent Historical Findings include: PMSHx -  Right elbow surgery, plantar fascia release b/l, HTN PSHx -  No tobacco or alcohol use, works at [employer] -  nothing to contribute Medications - amlodipine.   DATA REVIEWED: None to review.   PHYSICAL EXAM:  VS: BP:(!) 144/101  HR:71bpm  TEMP: ( )  RESP:   HT:5\' 6"  (167.6 cm)   WT:220 lb (99.8 kg)  BMI:35.6 PHYSICAL EXAM: Gen: NAD, alert, cooperative with exam, well-appearing HEENT: clear conjunctiva, EOMI CV:  no edema, capillary refill brisk,  Resp: non-labored, normal speech Skin: no rashes, normal turgor  Neuro: no gross deficits.  Psych:  alert and oriented Elbow: Unremarkable to inspection. No pain to palpation over the medial epicondyle or olecranon. Significant tenderness to the lateral epicondyle. Range of motion full pronation, supination, flexion, extension. Strength is full to all of the above directions. Pain exacerbated with middle finger extension to resistance. Some pain exacerbated with wrist extension to resistance.  Limited ultrasound: Left elbow: There was  spurring of observed of the lateral epicondyle. There is also some hypoechoic change within the extensor tendon.There is no increased vascularity with Doppler. Findings consistent with common extensor tendon tendinopathy.   ASSESSMENT & PLAN:   Left elbow pain Clinical exam and ultrasound findings are consistent with lateral epicondylitis. Most likely it is a tendinopathy at this point. - We'll refer to physical therapy. - He'll follow-up in 6 weeks. Can consider a rescan at that point. May try nitroglycerin in the future if no improvement

## 2015-12-09 NOTE — Assessment & Plan Note (Signed)
Clinical exam and ultrasound findings are consistent with lateral epicondylitis. Most likely it is a tendinopathy at this point. - We'll refer to physical therapy. - He'll follow-up in 6 weeks. Can consider a rescan at that point. May try nitroglycerin in the future if no improvement

## 2016-01-18 ENCOUNTER — Ambulatory Visit: Payer: Commercial Managed Care - HMO | Admitting: Sports Medicine

## 2016-01-19 ENCOUNTER — Ambulatory Visit (INDEPENDENT_AMBULATORY_CARE_PROVIDER_SITE_OTHER): Payer: Commercial Managed Care - HMO | Admitting: Sports Medicine

## 2016-01-19 ENCOUNTER — Encounter: Payer: Self-pay | Admitting: Sports Medicine

## 2016-01-19 DIAGNOSIS — M7712 Lateral epicondylitis, left elbow: Secondary | ICD-10-CM | POA: Diagnosis not present

## 2016-01-19 MED ORDER — NITROGLYCERIN 0.2 MG/HR TD PT24: MEDICATED_PATCH | TRANSDERMAL | 1 refills | Status: DC

## 2016-01-19 NOTE — Progress Notes (Signed)
  Nathaniel Mosley - 55 y.o. male MRN [id number]  Date of birth: Jul 01, 1960  SUBJECTIVE:  Including CC & ROS.   Nathaniel Mosley is a 55 y.o. M here for follow-up of L elbow pain.  Patient was previously seen on 12/09/15 for left lateral elbow pain that started about 4 months prior.  Found to have PE and US findings suggestive of lateral epicondylitis.  Treated with PT.  Patient reports that since that time he is feeling a little bit better.  He has some pain-free days now.  He continues to have pain with yard work and after a lot of golf.    He has been doing PT 2x/wk and switched to once weekly last week.  He is in stage 1 with stretching exercises.    He denies and numbness, tingling, weakness in hands.  HISTORY: Past Medical, Surgical, Social, and Family History Reviewed & Updated per EMR.   Pertinent Historical Findings include: PMSHx -  R elbow surgery, plantar fascia release b/l, HTN PSHx -  No tobacco or alcohol use, works at [employer] -  noncontributory Medications - amlodipine  DATA REVIEWED: 12/09/15 - Limited ultrasound: Left elbow: There was spurring of observed of the lateral epicondyle. There is also some hypoechoic change within the extensor tendon.There is no increased vascularity with Doppler. Findings consistent with common extensor tendon tendinopathy.  PHYSICAL EXAM:  VS: BP:(!) 152/103  HR:72bpm  TEMP: ( )  RESP:   HT:5\' 6"  (167.6 cm)   WT:220 lb (99.8 kg)  BMI:35.6 PHYSICAL EXAM: Gen: NAD, alert, cooperative with exam, well-appearing HEENT: clear conjunctiva, EOMI CV:  no edema, capillary refill brisk Resp: non-labored, normal speech Skin: no rashes, normal turgor  Neuro: no gross deficits.  Psych:  alert and oriented  L Elbow: Unremarkable to inspection. No pain to palpation over the medial epicondyle or olecranon. TTP over the lateral epicondyle. Range of motion full pronation, supination, flexion, extension. Strength is full  to all of the above directions. Pain exacerbated with wrist extension to resistance, but not to middle finger extension to resistance.  Limited ultrasound: Left elbow: There was spurring observed of the lateral epicondyle. There is also some hypoechoic change within the extensor tendon. Grossly unchanged from previous scan. Findings consistent with common extensor tendon tendinopathy.   ASSESSMENT & PLAN:   Left lateral epicondylitis Exam and [place] remain c/w lateral epicondylitis 2/2 tendinopathy - continue PT to at least learn strengthening exercises to use at home - Nitroglycerin patch protocol - f/u in 4-6 weeks with re-US at that time   Virginia Crews, MD, MPH PGY-3,  [facility] 01/19/2016 10:15 AM    Patient seen and evaluated with the resident. I agree with the above plan of care. Patient is making some progress with physical therapy. I think he should continue with this for the time being. We will also try topical nitroglycerin. He will continue to use pain as his guide with activity and he will follow-up with me in 4-6 weeks for repeat ultrasound.

## 2016-01-19 NOTE — Patient Instructions (Signed)
Nitroglycerin Protocol   Apply 1/4 nitroglycerin patch to affected area daily.  Change position of patch within the affected area every 24 hours.  You may experience a headache during the first 1-2 weeks of using the patch, these should subside.  If you experience headaches after beginning nitroglycerin patch treatment, you may take your preferred over the counter pain reliever.  Another side effect of the nitroglycerin patch is skin irritation or rash related to patch adhesive.  Please notify our office if you develop more severe headaches or rash, and stop the patch.  Tendon healing with nitroglycerin patch may require 12 to 24 weeks depending on the extent of injury.  Men should not use if taking Viagra, Cialis, or Levitra.   Do not use if you have migraines or rosacea.    Follow up in 4-6 weeks

## 2016-01-19 NOTE — Assessment & Plan Note (Signed)
Exam and [place] remain c/w lateral epicondylitis 2/2 tendinopathy - continue PT to at least learn strengthening exercises to use at home - Nitroglycerin patch protocol - f/u in 4-6 weeks with re-US at that time

## 2016-02-12 ENCOUNTER — Other Ambulatory Visit: Payer: Self-pay | Admitting: Urgent Care

## 2016-02-12 DIAGNOSIS — I10 Essential (primary) hypertension: Secondary | ICD-10-CM

## 2016-03-02 ENCOUNTER — Encounter: Payer: Self-pay | Admitting: Sports Medicine

## 2016-03-02 ENCOUNTER — Ambulatory Visit (INDEPENDENT_AMBULATORY_CARE_PROVIDER_SITE_OTHER): Payer: Commercial Managed Care - HMO | Admitting: Sports Medicine

## 2016-03-02 VITALS — BP 140/96 | Ht 66.0 in | Wt 220.0 lb

## 2016-03-02 DIAGNOSIS — M7712 Lateral epicondylitis, left elbow: Secondary | ICD-10-CM

## 2016-03-02 NOTE — Progress Notes (Signed)
   Subjective:    Patient ID: Nathaniel Mosley, male    DOB: 07/25/1960, 56 y.o.   MRN: [id number]  HPI   Nathaniel Mosley comes in today for follow-up on left elbow lateral epicondylitis. Overall, he continues to improve. He did not tolerate the nitroglycerin patch. It gave him a headache. He has had one additional physical therapy visit since his last office visit with me. He still has pain over the lateral epicondyle with activity but again he notes that it seems to be improving.    Review of Systems    as above Objective:   Physical Exam  Well-developed, well-nourished. No acute distress  Left elbow: Full range of motion. No effusion. No soft tissue swelling. He is still tender to palpation directly over the lateral epicondyle and has reproducible pain with resisted ECRB testing. Good grip strength. Good radial and ulnar pulses.  MSK ultrasound of the left elbow was performed. Limited images were obtained. Once again seen is the bone spur off of the lateral epicondyle. The hypoechoic change seen in the common extensor tendon on his last ultrasound is less evident today. Findings are consistent with lateral epicondylitis.      Assessment & Plan:   Left elbow pain secondary to lateral epicondylitis  Patient states that he has improved by about 50% since his initial office visit. He will continue with his home exercises and will return to formal physical therapy if his symptoms do not continue to improve. I did explain to him that it may take several more weeks or months for this to completely resolve. As long as his symptoms continue to improve I don't think we need to schedule a follow-up visit but he will follow-up if his symptoms once again worsen.

## 2016-04-20 ENCOUNTER — Other Ambulatory Visit: Payer: Self-pay | Admitting: Family Medicine

## 2016-04-21 NOTE — Telephone Encounter (Signed)
Meds ordered this encounter  Medications  . ketoconazole (NIZORAL) 2 % cream    Sig: APPLY TOPICALLY EVERY DAY    Dispense:  60 g    Refill:  0   Left message that the patient needs OV/CPE with Dr. Brigitte Pulse.

## 2016-05-31 ENCOUNTER — Other Ambulatory Visit: Payer: Self-pay | Admitting: Family Medicine

## 2016-05-31 DIAGNOSIS — I10 Essential (primary) hypertension: Secondary | ICD-10-CM

## 2016-06-06 ENCOUNTER — Ambulatory Visit (INDEPENDENT_AMBULATORY_CARE_PROVIDER_SITE_OTHER): Payer: Commercial Managed Care - HMO | Admitting: Family Medicine

## 2016-06-06 ENCOUNTER — Encounter: Payer: Self-pay | Admitting: Family Medicine

## 2016-06-06 VITALS — BP 143/92 | HR 76 | Temp 99.1°F | Resp 16 | Ht 66.0 in | Wt 228.0 lb

## 2016-06-06 DIAGNOSIS — Z1389 Encounter for screening for other disorder: Secondary | ICD-10-CM

## 2016-06-06 DIAGNOSIS — Z6835 Body mass index (BMI) 35.0-35.9, adult: Secondary | ICD-10-CM | POA: Diagnosis not present

## 2016-06-06 DIAGNOSIS — Z113 Encounter for screening for infections with a predominantly sexual mode of transmission: Secondary | ICD-10-CM | POA: Diagnosis not present

## 2016-06-06 DIAGNOSIS — Z0000 Encounter for general adult medical examination without abnormal findings: Secondary | ICD-10-CM

## 2016-06-06 DIAGNOSIS — Z130 Encounter for screening for diseases of the blood and blood-forming organs and certain disorders involving the immune mechanism: Secondary | ICD-10-CM

## 2016-06-06 DIAGNOSIS — J302 Other seasonal allergic rhinitis: Secondary | ICD-10-CM | POA: Diagnosis not present

## 2016-06-06 DIAGNOSIS — E669 Obesity, unspecified: Secondary | ICD-10-CM | POA: Insufficient documentation

## 2016-06-06 DIAGNOSIS — Z1383 Encounter for screening for respiratory disorder NEC: Secondary | ICD-10-CM | POA: Diagnosis not present

## 2016-06-06 DIAGNOSIS — E6609 Other obesity due to excess calories: Secondary | ICD-10-CM

## 2016-06-06 DIAGNOSIS — Z136 Encounter for screening for cardiovascular disorders: Secondary | ICD-10-CM | POA: Diagnosis not present

## 2016-06-06 DIAGNOSIS — Z125 Encounter for screening for malignant neoplasm of prostate: Secondary | ICD-10-CM

## 2016-06-06 DIAGNOSIS — Z1329 Encounter for screening for other suspected endocrine disorder: Secondary | ICD-10-CM

## 2016-06-06 DIAGNOSIS — I10 Essential (primary) hypertension: Secondary | ICD-10-CM | POA: Diagnosis not present

## 2016-06-06 DIAGNOSIS — IMO0001 Reserved for inherently not codable concepts without codable children: Secondary | ICD-10-CM

## 2016-06-06 LAB — POCT URINALYSIS DIP (MANUAL ENTRY)
BILIRUBIN UA: NEGATIVE
BILIRUBIN UA: NEGATIVE
Blood, UA: NEGATIVE
Glucose, UA: NEGATIVE
Leukocytes, UA: NEGATIVE
NITRITE UA: NEGATIVE
PH UA: 7 (ref 5.0–8.0)
PROTEIN UA: NEGATIVE
Spec Grav, UA: 1.02 (ref 1.030–1.035)
Urobilinogen, UA: 0.2 (ref ?–2.0)

## 2016-06-06 MED ORDER — AMLODIPINE BESYLATE 10 MG PO TABS: 10.0000 mg | ORAL_TABLET | Freq: Every day | ORAL | 3 refills | Status: DC

## 2016-06-06 NOTE — Patient Instructions (Addendum)
   IF you received an x-ray today, you will receive an invoice from [facility]. Please contact  Radiology at [phone] with questions or concerns regarding your invoice.   IF you received labwork today, you will receive an invoice from [facility]. Please contact [facility] at [phone] with questions or concerns regarding your invoice.   Our billing staff will not be able to assist you with questions regarding bills from these companies.  You will be contacted with the lab results as soon as they are available. The fastest way to get your results is to activate your My Chart account. Instructions are located on the last page of this paperwork. If you have not heard from us regarding the results in 2 weeks, please contact this office.     Health Maintenance, Male A healthy lifestyle and preventive care is important for your health and wellness. Ask your health care provider about what schedule of regular examinations is right for you. What should I know about weight and diet?  Eat a Healthy Diet  Eat plenty of vegetables, fruits, whole grains, low-fat dairy products, and lean protein.  Do not eat a lot of foods high in solid fats, added sugars, or salt. Maintain a Healthy Weight  Regular exercise can help you achieve or maintain a healthy weight. You should:  Do at least 150 minutes of exercise each week. The exercise should increase your heart rate and make you sweat (moderate-intensity exercise).  Do strength-training exercises at least twice a week. Watch Your Levels of Cholesterol and Blood Lipids  Have your blood tested for lipids and cholesterol every 5 years starting at 55 years of age. If you are at high risk for heart disease, you should start having your blood tested when you are 55 years old. You may need to have your cholesterol levels checked more often if:  Your lipid or cholesterol levels are high.  You are older than 55 years of age.  You are  at high risk for heart disease. What should I know about cancer screening? Many types of cancers can be detected early and may often be prevented. Lung Cancer  You should be screened every year for lung cancer if:  You are a current smoker who has smoked for at least 30 years.  You are a former smoker who has quit within the past 15 years.  Talk to your health care provider about your screening options, when you should start screening, and how often you should be screened. Colorectal Cancer  Routine colorectal cancer screening usually begins at 55 years of age and should be repeated every 5-10 years until you are 55 years old. You may need to be screened more often if early forms of precancerous polyps or small growths are found. Your health care provider may recommend screening at an earlier age if you have risk factors for colon cancer.  Your health care provider may recommend using home test kits to check for hidden blood in the stool.  A small camera at the end of a tube can be used to examine your colon (sigmoidoscopy or colonoscopy). This checks for the earliest forms of colorectal cancer. Prostate and Testicular Cancer  Depending on your age and overall health, your health care provider may do certain tests to screen for prostate and testicular cancer.  Talk to your health care provider about any symptoms or concerns you have about testicular or prostate cancer. Skin Cancer  Check your skin from head to toe regularly.    Tell your health care provider about any new moles or changes in moles, especially if:  There is a change in a mole's size, shape, or color.  You have a mole that is larger than a pencil eraser.  Always use sunscreen. Apply sunscreen liberally and repeat throughout the day.  Protect yourself by wearing long sleeves, pants, a wide-brimmed hat, and sunglasses when outside. What should I know about heart disease, diabetes, and high blood pressure?  If you are  18-39 years of age, have your blood pressure checked every 3-5 years. If you are 40 years of age or older, have your blood pressure checked every year. You should have your blood pressure measured twice-once when you are at a hospital or clinic, and once when you are not at a hospital or clinic. Record the average of the two measurements. To check your blood pressure when you are not at a hospital or clinic, you can use:  An automated blood pressure machine at a pharmacy.  A home blood pressure monitor.  Talk to your health care provider about your target blood pressure.  If you are between 45-79 years old, ask your health care provider if you should take aspirin to prevent heart disease.  Have regular diabetes screenings by checking your fasting blood sugar level.  If you are at a normal weight and have a low risk for diabetes, have this test once every three years after the age of 45.  If you are overweight and have a high risk for diabetes, consider being tested at a younger age or more often.  A one-time screening for abdominal aortic aneurysm (AAA) by ultrasound is recommended for men aged 65-75 years who are current or former smokers. What should I know about preventing infection? Hepatitis B  If you have a higher risk for hepatitis B, you should be screened for this virus. Talk with your health care provider to find out if you are at risk for hepatitis B infection. Hepatitis C  Blood testing is recommended for:  Everyone born from 1945 through 1965.  Anyone with known risk factors for hepatitis C. Sexually Transmitted Diseases (STDs)  You should be screened each year for STDs including gonorrhea and chlamydia if:  You are sexually active and are younger than 55 years of age.  You are older than 55 years of age and your health care provider tells you that you are at risk for this type of infection.  Your sexual activity has changed since you were last screened and you are at  an increased risk for chlamydia or gonorrhea. Ask your health care provider if you are at risk.  Talk with your health care provider about whether you are at high risk of being infected with HIV. Your health care provider may recommend a prescription medicine to help prevent HIV infection. What else can I do?  Schedule regular health, dental, and eye exams.  Stay current with your vaccines (immunizations).  Do not use any tobacco products, such as cigarettes, chewing tobacco, and e-cigarettes. If you need help quitting, ask your health care provider.  Limit alcohol intake to no more than 2 drinks per day. One drink equals 12 ounces of beer, 5 ounces of wine, or 1 ounces of hard liquor.  Do not use street drugs.  Do not share needles.  Ask your health care provider for help if you need support or information about quitting drugs.  Tell your health care provider if you often feel depressed.    Tell your health care provider if you have ever been abused or do not feel safe at home. This information is not intended to replace advice given to you by your health care provider. Make sure you discuss any questions you have with your health care provider. Document Released: 08/13/2007 Document Revised: 10/14/2015 Document Reviewed: 11/18/2014 Elsevier Interactive Patient Education  2017 Elsevier Inc.  

## 2016-06-06 NOTE — Progress Notes (Signed)
Subjective:    Patient ID: Nathaniel Mosley, male    DOB: Sep 17, 1960, 57 y.o.   MRN: [id number] Chief Complaint  Patient presents with  . Annual Exam    HPI  Nathaniel Mosley is a 56 yo male here today for his full physical. Last was a little over a yr prior by a colleague.  Primary Preventative Screenings: Prostate Cancer:  STI screening: Colorectal Cancer: Colonoscopy done 2014, was normal, f/u in 5 years. H/o tobacco use AAA/Lung:  Cardiac:  Weight/blood sugar: OTC/vit/supp/herbal: Diet/Exercise:  Dentist/Optho: eye exams at [facility] as wears glasses, sees dentist q6 mos Immunizations: did get flu shot this year 11/29/15 Immunization History  Administered Date(s) Administered  . Influenza-Unspecified 11/29/2014  . Td 12/16/2010     Chronic Medical Conditions: HTN: does not check outside office but will start HLD: Obesity: only exercise is walking at work.  Patient is single, lives on his own. He does not have any children. Denies smoking cigarettes or alcohol use. Works in [employer]. Tries to exercise regularly but has not been as active with this in the past 6 months due to work.   Past Medical History:  Diagnosis Date  . Allergy   . Hypertension   . Kidney stone on left side    Past Surgical History:  Procedure Laterality Date  . FRACTURE SURGERY    . OPEN REPAIR PERIARTICULAR FRACTURE / DISLOCATION ELBOW  1998  . PLANTAR FASCIA RELEASE  2013   Current Outpatient Prescriptions on File Prior to Visit  Medication Sig Dispense Refill  . Acetaminophen (TYLENOL PO) Take by mouth as needed.    . cetirizine (ZYRTEC) 10 MG tablet Take 10 mg by mouth daily.    . fluticasone (FLONASE) 50 MCG/ACT nasal spray USE 2 SPRAYS IN EACH NOSTRIL TWICE A DAY. OFFICE VISIT NEEDED FOR REFILLS" 16 g 2  . ketoconazole (NIZORAL) 2 % cream APPLY TOPICALLY EVERY DAY 60 g 0  . benzonatate (TESSALON) 200 MG capsule      No current facility-administered medications on file prior to  visit.    Allergies  Allergen Reactions  . Azithromycin Other (See Comments)    GI upset  . Erythromycin    Family History  Problem Relation Age of Onset  . Cancer Mother   . Diabetes Father    Social History   Social History  . Marital status: Single    Spouse name: N/A  . Number of children: N/A  . Years of education: N/A   Social History Main Topics  . Smoking status: Never Smoker  . Smokeless tobacco: Not on file  . Alcohol use No  . Drug use: No  . Sexual activity: No   Other Topics Concern  . Not on file   Social History Narrative  . No narrative on file   Depression screen [facility] 2/9 06/06/2016 01/19/2016 12/09/2015 09/03/2015 03/05/2015  Decreased Interest 0 0 0 0 0  Down, Depressed, Hopeless 0 0 0 0 0  PHQ - 2 Score 0 0 0 0 0     Review of Systems  All other systems reviewed and are negative.  Nocturia of 1-2x/night at baseline.    Objective:   Physical Exam  Constitutional: He is oriented to person, place, and time. He appears well-developed and well-nourished. No distress.  HENT:  Head: Normocephalic and atraumatic.  Right Ear: Tympanic membrane, external ear and ear canal normal.  Left Ear: Tympanic membrane, external ear and ear canal normal.  Nose: Nose normal.  Mouth/Throat: Uvula is midline, oropharynx is clear and moist and mucous membranes are normal. No oropharyngeal exudate.  Eyes: Conjunctivae are normal. Right eye exhibits no discharge. Left eye exhibits no discharge. No scleral icterus.  Neck: Normal range of motion. Neck supple. No thyromegaly present.  Cardiovascular: Normal rate, regular rhythm, normal heart sounds and intact distal pulses.   Pulmonary/Chest: Effort normal and breath sounds normal. No respiratory distress.  Abdominal: Soft. Bowel sounds are normal. He exhibits no distension and no mass. There is no tenderness. There is no rebound and no guarding.  Genitourinary: Rectum normal and prostate normal. Rectal exam shows no  external hemorrhoid, no mass, no tenderness and anal tone normal. Prostate is not enlarged and not tender.  Musculoskeletal: He exhibits no edema.  Lymphadenopathy:    He has no cervical adenopathy.  Neurological: He is alert and oriented to person, place, and time. He has normal reflexes. No cranial nerve deficit. He exhibits normal muscle tone. Gait normal.  Reflex Scores:      Patellar reflexes are 2+ on the right side and 2+ on the left side. Skin: Skin is warm and dry. No rash noted. He is not diaphoretic. No erythema.  Psychiatric: He has a normal mood and affect. His behavior is normal.      BP 138/90   Pulse 76   Temp 99.1 F (37.3 C) (Oral)   Resp 16   Ht 5\' 6"  (1.676 m)   Wt 228 lb (103.4 kg)   SpO2 96%   BMI 36.80 kg/m   [facility] reading (PRIMARY) by  Dr. Brigitte Pulse. EKG: NSR, no acute ischemic change  Results for orders placed or performed in visit on 06/06/16  POCT urinalysis dipstick  Result Value Ref Range   Color, UA yellow yellow   Clarity, UA clear clear   Glucose, UA negative negative   Bilirubin, UA negative negative   Ketones, POC UA negative negative   Spec Grav, UA 1.020 1.030 - 1.035   Blood, UA negative negative   pH, UA 7.0 5.0 - 8.0   Protein Ur, POC negative negative   Urobilinogen, UA 0.2 Negative - 2.0   Nitrite, UA Negative Negative   Leukocytes, UA Negative Negative    Assessment & Plan:  EKG, tsh, cmp, lipid, cbc, ua, psa, a1c   1. Annual physical exam   2. Routine screening for STI (sexually transmitted infection)   3. Screening for cardiovascular, respiratory, and genitourinary diseases   4. Screening for deficiency anemia   5. Screening for prostate cancer   6. Screening for thyroid disorder   7. Essential hypertension - not at goal, increase amlodipine form 5 to 10mg .  8. Seasonal allergic rhinitis, unspecified chronicity, unspecified trigger   9. Class 2 obesity due to excess calories with serious comorbidity and body mass index (BMI)  of 35.0 to 35.9 in adult     Orders Placed This Encounter  Procedures  . Comprehensive metabolic panel    Order Specific Question:   Has the patient fasted?    Answer:   Yes  . CBC  . TSH  . Lipid panel    Order Specific Question:   Has the patient fasted?    Answer:   Yes  . PSA  . Hemoglobin A1c  . HIV antibody  . HCV Ab w/Rflx to Verification  . POCT urinalysis dipstick  . EKG 12-Lead    Meds ordered this encounter  Medications  . amLODipine (NORVASC) 10 MG tablet  Sig: Take 1 tablet (10 mg total) by mouth daily.    Dispense:  90 tablet    Refill:  3      Delman Cheadle, M.D.  Primary Care at [facility] [address] [place], [place] [address] [phone] phone [phone] fax  06/06/16 10:59 PM

## 2016-06-07 LAB — COMPREHENSIVE METABOLIC PANEL
ALT: 17 IU/L (ref 0–44)
AST: 19 IU/L (ref 0–40)
Albumin/Globulin Ratio: 1.7 (ref 1.2–2.2)
Albumin: 4.3 g/dL (ref 3.5–5.5)
Alkaline Phosphatase: 71 IU/L (ref 39–117)
BILIRUBIN TOTAL: 0.5 mg/dL (ref 0.0–1.2)
BUN/Creatinine Ratio: 16 (ref 9–20)
BUN: 14 mg/dL (ref 6–24)
CALCIUM: 9.2 mg/dL (ref 8.7–10.2)
CHLORIDE: 99 mmol/L (ref 96–106)
CO2: 25 mmol/L (ref 18–29)
CREATININE: 0.86 mg/dL (ref 0.76–1.27)
GFR calc non Af Amer: 98 mL/min/{1.73_m2} (ref >59)
GFR, EST AFRICAN AMERICAN: 114 mL/min/{1.73_m2} (ref >59)
Globulin, Total: 2.5 g/dL (ref 1.5–4.5)
Glucose: 108 mg/dL — ABNORMAL HIGH (ref 65–99)
Potassium: 3.5 mmol/L (ref 3.5–5.2)
Sodium: 142 mmol/L (ref 134–144)
TOTAL PROTEIN: 6.8 g/dL (ref 6.0–8.5)

## 2016-06-07 LAB — TSH: TSH: 3.82 u[IU]/mL (ref 0.450–4.500)

## 2016-06-07 LAB — CBC
HEMOGLOBIN: 15.9 g/dL (ref 13.0–17.7)
Hematocrit: 46.6 % (ref 37.5–51.0)
MCH: 29.7 pg (ref 26.6–33.0)
MCHC: 34.1 g/dL (ref 31.5–35.7)
MCV: 87 fL (ref 79–97)
PLATELETS: 234 10*3/uL (ref 150–379)
RBC: 5.36 x10E6/uL (ref 4.14–5.80)
RDW: 15.2 % (ref 12.3–15.4)
WBC: 5.1 10*3/uL (ref 3.4–10.8)

## 2016-06-07 LAB — LIPID PANEL
CHOLESTEROL TOTAL: 196 mg/dL (ref 100–199)
Chol/HDL Ratio: 6.5 ratio — ABNORMAL HIGH (ref 0.0–5.0)
HDL: 30 mg/dL — AB (ref >39)
LDL Calculated: 124 mg/dL — ABNORMAL HIGH (ref 0–99)
Triglycerides: 212 mg/dL — ABNORMAL HIGH (ref 0–149)
VLDL CHOLESTEROL CAL: 42 mg/dL — AB (ref 5–40)

## 2016-06-07 LAB — HCV AB W/RFLX TO VERIFICATION

## 2016-06-07 LAB — PSA: Prostate Specific Ag, Serum: 0.7 ng/mL (ref 0.0–4.0)

## 2016-06-07 LAB — HEMOGLOBIN A1C
Est. average glucose Bld gHb Est-mCnc: 137 mg/dL
HEMOGLOBIN A1C: 6.4 % — AB (ref 4.8–5.6)

## 2016-06-07 LAB — HIV ANTIBODY (ROUTINE TESTING W REFLEX): HIV SCREEN 4TH GENERATION: NONREACTIVE

## 2016-06-07 LAB — HCV INTERPRETATION

## 2016-06-20 DIAGNOSIS — H1013 Acute atopic conjunctivitis, bilateral: Secondary | ICD-10-CM | POA: Diagnosis not present

## 2016-06-20 DIAGNOSIS — H40033 Anatomical narrow angle, bilateral: Secondary | ICD-10-CM | POA: Diagnosis not present

## 2016-07-01 ENCOUNTER — Other Ambulatory Visit: Payer: Self-pay | Admitting: Physician Assistant

## 2016-07-01 DIAGNOSIS — I10 Essential (primary) hypertension: Secondary | ICD-10-CM

## 2016-09-22 ENCOUNTER — Emergency Department (HOSPITAL_COMMUNITY): Payer: 59

## 2016-09-22 ENCOUNTER — Encounter (HOSPITAL_COMMUNITY): Payer: Self-pay

## 2016-09-22 ENCOUNTER — Emergency Department (HOSPITAL_COMMUNITY)
Admission: EM | Admit: 2016-09-22 | Discharge: 2016-09-22 | Disposition: A | Discharge status: Home / Self Care | Payer: 59 | Attending: Emergency Medicine | Admitting: Emergency Medicine

## 2016-09-22 DIAGNOSIS — N200 Calculus of kidney: Secondary | ICD-10-CM | POA: Diagnosis not present

## 2016-09-22 DIAGNOSIS — I10 Essential (primary) hypertension: Secondary | ICD-10-CM | POA: Diagnosis not present

## 2016-09-22 DIAGNOSIS — R1012 Left upper quadrant pain: Secondary | ICD-10-CM | POA: Diagnosis present

## 2016-09-22 DIAGNOSIS — Z79899 Other long term (current) drug therapy: Secondary | ICD-10-CM | POA: Diagnosis not present

## 2016-09-22 DIAGNOSIS — R109 Unspecified abdominal pain: Secondary | ICD-10-CM | POA: Diagnosis not present

## 2016-09-22 LAB — URINALYSIS, ROUTINE W REFLEX MICROSCOPIC
BILIRUBIN URINE: NEGATIVE
GLUCOSE, UA: NEGATIVE mg/dL
KETONES UR: NEGATIVE mg/dL
Leukocytes, UA: NEGATIVE
Nitrite: NEGATIVE
PH: 7 (ref 5.0–8.0)
Protein, ur: 30 mg/dL — AB
Specific Gravity, Urine: 1.012 (ref 1.005–1.030)
Squamous Epithelial / LPF: NONE SEEN

## 2016-09-22 LAB — BASIC METABOLIC PANEL
Anion gap: 7 (ref 5–15)
BUN: 10 mg/dL (ref 6–20)
CALCIUM: 9 mg/dL (ref 8.9–10.3)
CO2: 29 mmol/L (ref 22–32)
CREATININE: 0.97 mg/dL (ref 0.61–1.24)
Chloride: 104 mmol/L (ref 101–111)
GFR calc non Af Amer: >60 mL/min (ref >60)
GLUCOSE: 156 mg/dL — AB (ref 65–99)
Potassium: 3.5 mmol/L (ref 3.5–5.1)
Sodium: 140 mmol/L (ref 135–145)

## 2016-09-22 LAB — CBC
HCT: 45.5 % (ref 39.0–52.0)
Hemoglobin: 15.8 g/dL (ref 13.0–17.0)
MCH: 29.2 pg (ref 26.0–34.0)
MCHC: 34.7 g/dL (ref 30.0–36.0)
MCV: 84.1 fL (ref 78.0–100.0)
PLATELETS: 223 10*3/uL (ref 150–400)
RBC: 5.41 MIL/uL (ref 4.22–5.81)
RDW: 13.3 % (ref 11.5–15.5)
WBC: 6.8 10*3/uL (ref 4.0–10.5)

## 2016-09-22 MED ORDER — SODIUM CHLORIDE 0.9 % IV BOLUS (SEPSIS): 1000.0000 mL | Freq: Once | INTRAVENOUS | Status: DC

## 2016-09-22 MED ORDER — ONDANSETRON 4 MG PO TBDP: 4.0000 mg | ORAL_TABLET | Freq: Three times a day (TID) | ORAL | 0 refills | Status: DC | PRN

## 2016-09-22 MED ORDER — OXYCODONE-ACETAMINOPHEN 5-325 MG PO TABS: 1.0000 | ORAL_TABLET | ORAL | 0 refills | Status: DC | PRN

## 2016-09-22 MED ORDER — KETOROLAC TROMETHAMINE 30 MG/ML IJ SOLN: 30.0000 mg | Freq: Once | INTRAMUSCULAR | Status: DC

## 2016-09-22 MED ORDER — TAMSULOSIN HCL 0.4 MG PO CAPS: 0.4000 mg | ORAL_CAPSULE | Freq: Two times a day (BID) | ORAL | 0 refills | Status: DC

## 2016-09-22 MED ORDER — ONDANSETRON HCL 4 MG/2ML IJ SOLN: 4.0000 mg | Freq: Once | INTRAMUSCULAR | Status: DC

## 2016-09-22 MED ORDER — OXYCODONE-ACETAMINOPHEN 5-325 MG PO TABS
1.0000 | ORAL_TABLET | ORAL | Status: DC | PRN
  Administered 2016-09-22: 1 via ORAL

## 2016-09-22 MED ORDER — OXYCODONE-ACETAMINOPHEN 5-325 MG PO TABS
ORAL_TABLET | ORAL | Status: AC
  Filled 2016-09-22: qty 1

## 2016-09-22 NOTE — ED Provider Notes (Signed)
[place] [facility] DEPT Provider Note   CSN: [id number] Arrival date & time: 09/22/16  0458     History   Chief Complaint Chief Complaint  Patient presents with  . Flank Pain    HPI Nathaniel Mosley is a 56 y.o. male.  HPI   Pt is a 56 yo male with PMH of HTN who presents to the ED with complaint of left flank pain, onset 4am. Pt reports waking up at 4am due to having constant, waxing and wanning sharp stabbing pain to left flank. Reports pain intermittently radiates to left lateral abdomen. Denies any aggravating or alleviating factors. Reports having N/V after onset of pain. Reports taking Aleve at home without relief. He reports pain feels similar to when he had a kidney stone years ago. Denies fever, chills, CP, SOB, abdominal pain, hematemesis, diarrhea, constipation, urinary sxs, hematuria, penile or testicular pain/swelling. Denies hx of abdominal surgeries.   Past Medical History:  Diagnosis Date  . Allergy   . Hypertension   . Kidney stone on left side     Patient Active Problem List   Diagnosis Date Noted  . Class 2 obesity due to excess calories with serious comorbidity and body mass index (BMI) of 35.0 to 35.9 in adult 06/06/2016  . Left lateral epicondylitis 12/09/2015  . Seasonal allergies 07/07/2011  . Kidney stone 07/07/2011  . HTN (hypertension) 04/20/2011    Past Surgical History:  Procedure Laterality Date  . FRACTURE SURGERY    . OPEN REPAIR PERIARTICULAR FRACTURE / DISLOCATION ELBOW  1998  . PLANTAR FASCIA RELEASE  2013       Home Medications    Prior to Admission medications   Medication Sig Start Date End Date Taking? Authorizing Provider  amLODipine (NORVASC) 10 MG tablet Take 1 tablet (10 mg total) by mouth daily. 06/06/16  Yes Shawnee Knapp, MD  naproxen sodium (ANAPROX) 220 MG tablet Take 220 mg by mouth 2 (two) times daily as needed (for pain).   Yes [provider]  amLODipine (NORVASC) 5 MG tablet TAKE 1 TABLET BY MOUTH EVERY  DAY Patient not taking: Reported on 09/22/2016 07/02/16   Gale Journey, Damaris Hippo, PA-C  fluticasone (FLONASE) 50 MCG/ACT nasal spray USE 2 SPRAYS IN EACH NOSTRIL TWICE A DAY. OFFICE VISIT NEEDED FOR REFILLS" Patient not taking: Reported on 09/22/2016 05/31/15   Orma Flaming, MD  ketoconazole (NIZORAL) 2 % cream APPLY TOPICALLY EVERY DAY Patient not taking: Reported on 09/22/2016 04/21/16   Harrison Mons, PA-C  ondansetron (ZOFRAN ODT) 4 MG disintegrating tablet Take 1 tablet (4 mg total) by mouth every 8 (eight) hours as needed for nausea or vomiting. 09/22/16   Nona Dell, PA-C  oxyCODONE-acetaminophen (PERCOCET/ROXICET) 5-325 MG tablet Take 1 tablet by mouth every 4 (four) hours as needed for severe pain. 09/22/16   Nona Dell, PA-C  tamsulosin (FLOMAX) 0.4 MG CAPS capsule Take 1 capsule (0.4 mg total) by mouth 2 (two) times daily. 09/22/16   Nona Dell, PA-C    Family History Family History  Problem Relation Age of Onset  . Cancer Mother   . Diabetes Father     Social History Social History  Substance Use Topics  . Smoking status: Never Smoker  . Smokeless tobacco: Not on file  . Alcohol use No     Allergies   Azithromycin and Erythromycin   Review of Systems Review of Systems  Gastrointestinal: Positive for nausea and vomiting.  Genitourinary: Positive for flank pain.  All other  systems reviewed and are negative.    Physical Exam Updated Vital Signs BP (!) 134/91   Pulse 71   Temp (!) 97.5 F (36.4 C) (Oral)   Resp (!) 22   Ht 5\' 6"  (1.676 m)   Wt 102.1 kg (225 lb)   SpO2 93%   BMI 36.32 kg/m   Physical Exam  Constitutional: He is oriented to person, place, and time. He appears well-developed and well-nourished.  HENT:  Head: Normocephalic and atraumatic.  Mouth/Throat: Oropharynx is clear and moist.  Eyes: Conjunctivae and EOM are normal. Right eye exhibits no discharge. Left eye exhibits no discharge. No scleral icterus.    Neck: Normal range of motion. Neck supple.  Cardiovascular: Normal rate, regular rhythm, normal heart sounds and intact distal pulses.   Pulmonary/Chest: Effort normal and breath sounds normal. No respiratory distress. He has no wheezes. He has no rales. He exhibits no tenderness.  Abdominal: Soft. Normal appearance and bowel sounds are normal. He exhibits no distension and no mass. There is no tenderness. There is CVA tenderness (mild left CVA tenderness). There is no rigidity, no rebound and no guarding. A hernia (soft umbilical, nontender, chronic) is present.  Musculoskeletal: Normal range of motion. He exhibits no edema.  Neurological: He is alert and oriented to person, place, and time.  Skin: Skin is warm and dry.  Nursing note and vitals reviewed.    ED Treatments / Results  Labs (all labs ordered are listed, but only abnormal results are displayed) Labs Reviewed  URINALYSIS, ROUTINE W REFLEX MICROSCOPIC - Abnormal; Notable for the following:       Result Value   APPearance CLOUDY (*)    Hgb urine dipstick SMALL (*)    Protein, ur 30 (*)    Bacteria, UA RARE (*)    All other components within normal limits  BASIC METABOLIC PANEL - Abnormal; Notable for the following:    Glucose, Bld 156 (*)    All other components within normal limits  CBC    EKG  EKG Interpretation None       Radiology Ct Renal Stone Study  Result Date: 09/22/2016 CLINICAL DATA:  Acute left flank pain. EXAM: CT ABDOMEN AND PELVIS WITHOUT CONTRAST TECHNIQUE: Multidetector CT imaging of the abdomen and pelvis was performed following the standard protocol without IV contrast. COMPARISON:  CT scan of August 19, 2003. FINDINGS: Lower chest: No acute abnormality. Hepatobiliary: 2.6 cm rounded low density is seen in dome of right hepatic lobe with Hounsfield measurement of approximately 0 most consistent with cyst or hemangioma. No gallstones are noted. Pancreas: Unremarkable. No pancreatic ductal dilatation  or surrounding inflammatory changes. Spleen: Normal in size without focal abnormality. Adrenals/Urinary Tract: Adrenal glands are unremarkable. Bilateral nephrolithiasis is noted, with largest calculus measuring 8 mm in lower pole of left kidney. Mild left hydroureteronephrosis is noted secondary to 4 mm calculus at the left ureterovesical junction. Bladder is otherwise unremarkable. Stomach/Bowel: Stomach is within normal limits. Appendix appears normal. No evidence of bowel wall thickening, distention, or inflammatory changes. Vascular/Lymphatic: No significant vascular findings are present. No enlarged abdominal or pelvic lymph nodes. Reproductive: Prostate is unremarkable. Other: Moderate size fat containing periumbilical hernia is noted. No abnormal fluid collection is noted. Musculoskeletal: No acute or significant osseous findings. IMPRESSION: Bilateral nephrolithiasis. Mild left hydroureteronephrosis is noted secondary to 4 mm calculus at the left ureterovesical junction. Moderate size fat containing periumbilical hernia. Electronically Signed   By: Marijo Conception, M.D.   On: 09/22/2016 08:23  Procedures Procedures (including critical care time)  Medications Ordered in ED Medications  oxyCODONE-acetaminophen (PERCOCET/ROXICET) 5-325 MG per tablet 1 tablet (1 tablet Oral Given 09/22/16 0514)  ketorolac (TORADOL) 30 MG/ML injection 30 mg (not administered)  sodium chloride 0.9 % bolus 1,000 mL (not administered)  ondansetron (ZOFRAN) injection 4 mg (not administered)     Initial Impression / Assessment and Plan / ED Course  I have reviewed the triage vital signs and the nursing notes.  Pertinent labs & imaging results that were available during my care of the patient were reviewed by me and considered in my medical decision making (see chart for details).    Pt presents with left flank pain with N/V that started this morning. Reports hx of similar pain years ago when he had a kidney  stone. Denies fever, abdominal pain. VSS. Exam showed mild left CVA tenderness. Abdomen soft, nontender. Remaining exam unremarkable. Pt given IVF and pain meds. UA showed small hgb, 0-5 RBCs, no signs of infection. Remaining labs unremarkable. Will order CT renal study for further evaluation.   CT showed 8 mm stone in lower pole of left kidney, 4 mm stone noted at left ureterovesicular junction with mild hydroureteronephrosis. On reevaluation patient is sitting resting comfortably in bed and reports significant improvement of pain. Tolerating by mouth. Discussed results and plan for discharge with patient. Plan to discharge patient home with pain meds, antiemetics and Flomax. Advised patient to follow up with urologist as needed. Discussed return precautions.   Final Clinical Impressions(s) / ED Diagnoses   Final diagnoses:  Kidney stone    New Prescriptions New Prescriptions   ONDANSETRON (ZOFRAN ODT) 4 MG DISINTEGRATING TABLET    Take 1 tablet (4 mg total) by mouth every 8 (eight) hours as needed for nausea or vomiting.   OXYCODONE-ACETAMINOPHEN (PERCOCET/ROXICET) 5-325 MG TABLET    Take 1 tablet by mouth every 4 (four) hours as needed for severe pain.   TAMSULOSIN (FLOMAX) 0.4 MG CAPS CAPSULE    Take 1 capsule (0.4 mg total) by mouth 2 (two) times daily.     Nona Dell, PA-C 09/22/16 Jackson, April, MD 09/26/16 2308

## 2016-09-22 NOTE — Discharge Instructions (Signed)
Take your medications as prescribed as needed for pain and nausea. Continue taking fluids at home to remain hydrated. Follow up with the urology clinic listed below if your symptoms have not improved over the next 3-4 days.  Please return to the Emergency Department if symptoms worsen or new onset of fever, abdominal pain, vomiting and unable to keep fluids down, difficulty urinating, worsening pain.

## 2016-09-22 NOTE — ED Triage Notes (Signed)
Left flank pain onset suddenly hx of stones x 1 year ago.

## 2016-12-22 ENCOUNTER — Other Ambulatory Visit: Payer: Self-pay | Admitting: Physician Assistant

## 2016-12-22 DIAGNOSIS — I10 Essential (primary) hypertension: Secondary | ICD-10-CM

## 2017-02-16 ENCOUNTER — Encounter: Payer: Self-pay | Admitting: Family Medicine

## 2017-02-16 ENCOUNTER — Ambulatory Visit: Payer: 59 | Admitting: Family Medicine

## 2017-02-16 ENCOUNTER — Other Ambulatory Visit: Payer: Self-pay

## 2017-02-16 VITALS — BP 128/78 | HR 86 | Temp 98.9°F | Resp 16 | Ht 66.0 in | Wt 225.0 lb

## 2017-02-16 DIAGNOSIS — R7303 Prediabetes: Secondary | ICD-10-CM | POA: Diagnosis not present

## 2017-02-16 DIAGNOSIS — E6609 Other obesity due to excess calories: Secondary | ICD-10-CM

## 2017-02-16 DIAGNOSIS — E8881 Metabolic syndrome: Secondary | ICD-10-CM | POA: Diagnosis not present

## 2017-02-16 DIAGNOSIS — I10 Essential (primary) hypertension: Secondary | ICD-10-CM | POA: Diagnosis not present

## 2017-02-16 DIAGNOSIS — Z6836 Body mass index (BMI) 36.0-36.9, adult: Secondary | ICD-10-CM

## 2017-02-16 MED ORDER — AMLODIPINE BESYLATE 5 MG PO TABS: 5.0000 mg | ORAL_TABLET | Freq: Every day | ORAL | 1 refills | Status: DC

## 2017-02-16 NOTE — Patient Instructions (Addendum)
We will send in a refill of whatever dose of amlodipine you have been on. Recheck in 6 months with fasting labs at your physical.   IF you received an x-ray today, you will receive an invoice from [facility]. Please contact [facility] at [phone] with questions or concerns regarding your invoice.   IF you received labwork today, you will receive an invoice from [facility]. Please contact [facility] at [phone] with questions or concerns regarding your invoice.   Our billing staff will not be able to assist you with questions regarding bills from these companies.  You will be contacted with the lab results as soon as they are available. The fastest way to get your results is to activate your My Chart account. Instructions are located on the last page of this paperwork. If you have not heard from [place] regarding the results in 2 weeks, please contact this office.      Metabolic Syndrome Metabolic syndrome is the presence of at least three factors that increase your risk of getting cardiovascular disease and diabetes. These factors are:  High blood sugar.  High blood triglyceride level.  High blood pressure.  Low levels of good blood cholesterol (high-density lipoprotein or HDL).  Excess weight around the waist. This factor is present with a waist measurement of: ? More than 40 inches in men. ? More than 35 inches in women.  Metabolic syndrome is sometimes called insulin resistance syndrome and syndrome X. What are the causes? The exact cause is not known, but genetics and lifestyle choices play a role. What increases the risk? You are more likely to develop metabolic syndrome if:  You eat a diet high in calories and saturated fat.  You do not exercise regularly.  You are overweight.  You have a family history of metabolic syndrome.  You are Asian.  You are older in age.  You have insulin resistance.  You use any tobacco products, including  cigarettes, chewing tobacco, or electronic cigarettes.  What are the signs or symptoms? Metabolic syndrome has no specific symptoms. How is this diagnosed? To make a diagnosis, your health care provider will determine whether you have at least three of the factors that make up metabolic syndrome by:  Taking your blood pressure.  Measuring your waist.  Ordering blood tests.  How is this treated? Treatment may include:  Lifestyle changes to reduce your risk for heart disease and stroke, such as: ? Exercise. ? Weight loss. ? Maintaining a healthy diet. ? Quitting the use of any tobacco products, including cigarettes, chewing tobacco, or electronic cigarettes.  Medicines that: ? Help your body to maintain glucose control. ? Reduce your blood pressure and your blood triglyceride levels.  Follow these instructions at home:  Exercise regularly.  Maintain a healthy diet.  Do not use any tobacco products, including cigarettes, chewing tobacco, or electronic cigarettes. If you need help quitting, ask your health care provider.  Keep all follow-up visits as directed by your health care provider. This is important.  Measure your waist regularly and record the measurement. To measure your waist: ? Stand up straight. ? Breathe out. ? Wrap the measuring tape around the part of your waist that is just above your hipbones. ? Read the measurement. Contact a health care provider if:  You feel very tired.  You develop excessive thirst.  You pass large quantities of urine.  You put on weight around your waist.  You have headaches over and over again.  You have a  dizzy spell. Get help right away if:  You develop sudden blurred vision.  You develop a sudden dizzy spell.  You have sudden trouble speaking or swallowing.  You have sudden weakness in your arm or leg.  You have chest pains or trouble breathing.  You feel like your heartbeat is abnormal.  You faint. This  information is not intended to replace advice given to you by your health care provider. Make sure you discuss any questions you have with your health care provider. Document Released: 05/24/2007 Document Revised: 07/23/2015 Document Reviewed: 09/20/2013 Elsevier Interactive Patient Education  2018 [employer]. Diet for Metabolic Syndrome Metabolic syndrome is a disorder that includes at least three of these conditions:  Abdominal obesity.  Too much sugar in your blood.  High blood pressure.  Higher than normal amount of fat (lipids) in your blood.  Lower than normal level of "good" cholesterol (HDL).  Following a healthy diet can help to keep metabolic syndrome under control. It can also help to prevent the development of conditions that are associated with metabolic syndrome, such as diabetes, heart disease, and stroke. Along with exercise, a healthy diet:  Helps to improve the way that the body uses insulin.  Promotes weight loss. A common goal for people with this condition is to lose at least 7 to 10 percent of their starting weight.  What do I need to know about this diet?  Use the glycemic index (GI) to plan your meals. The index tells you how quickly a food will raise your blood sugar. Choose foods that have low GI values. These foods take a longer time to raise blood sugar.  Keep track of how many calories you take in. Eating the right amount of calories will help your achieve a healthy weight.  You may want to follow a Mediterranean diet. This diet includes lots of vegetables, lean meats or fish, whole grains, fruits, and healthy oils and fats. What foods can I eat? Grains Stone-ground whole wheat. Pumpernickel bread. Whole-grain bread, crackers, tortillas, cereal, and pasta. Unsweetened oatmeal.Bulgur.Barley.Quinoa.Brown rice or wild rice. Vegetables Lettuce. Spinach. Peas. Beets. Cauliflower. Cabbage. Broccoli. Carrots. Tomatoes. Squash. Eggplant. Herbs. Peppers.  Onions. Cucumbers. Brussels sprouts. Sweet potatoes. Yams. Beans. Lentils. Fruits Berries. Apples. Oranges. Grapes. Mango. Pomegranate. Kiwi. Cherries. Meats and Other Protein Sources Seafood and shellfish. Lean meats.Poultry. Tofu. Dairy Low-fat or fat-free dairy products, such as milk, yogurt, and cheese. Beverages Water. Low-fat milk. Milk alternatives, like soy milk or almond milk. Real fruit juice. Condiments Low-sugar or sugar-free ketchup, barbecue sauce, and mayonnaise. Mustard. Relish. Fats and Oils Avocado. Canola or olive oil. Nuts and nut butters.Seeds. The items listed above may not be a complete list of recommended foods or beverages. Contact your dietitian for more options. What foods are not recommended? Red meat. Palm oil and coconut oil. Processed foods. Fried foods. Alcohol. Sweetened drinks, such as iced tea and soda. Sweets. Salty foods. The items listed above may not be a complete list of foods and beverages to avoid. Contact your dietitian for more information. This information is not intended to replace advice given to you by your health care provider. Make sure you discuss any questions you have with your health care provider. Document Released: 07/01/2014 Document Revised: 06/26/2015 Document Reviewed: 02/26/2014 Elsevier Interactive Patient Education  2018 [place] refers to food and lifestyle choices that are based on the traditions of countries located on the [employer]. This way of eating has been  shown to help prevent certain conditions and improve outcomes for people who have chronic diseases, like kidney disease and heart disease. What are tips for following this plan? Lifestyle  Cook and eat meals together with your family, when possible.  Drink enough fluid to keep your urine clear or pale yellow.  Be physically active every day. This includes: ? Aerobic exercise like running or  swimming. ? Leisure activities like gardening, walking, or housework.  Get 7-8 hours of sleep each night.  If recommended by your health care provider, drink red wine in moderation. This means 1 glass a day for nonpregnant women and 2 glasses a day for men. A glass of wine equals 5 oz (150 mL). Reading food labels  Check the serving size of packaged foods. For foods such as rice and pasta, the serving size refers to the amount of cooked product, not dry.  Check the total fat in packaged foods. Avoid foods that have saturated fat or trans fats.  Check the ingredients list for added sugars, such as corn syrup. Shopping  At the grocery store, buy most of your food from the areas near the walls of the store. This includes: ? Fresh fruits and vegetables (produce). ? Grains, beans, nuts, and seeds. Some of these may be available in unpackaged forms or large amounts (in bulk). ? Fresh seafood. ? Poultry and eggs. ? Low-fat dairy products.  Buy whole ingredients instead of prepackaged foods.  Buy fresh fruits and vegetables in-season from local farmers markets.  Buy frozen fruits and vegetables in resealable bags.  If you do not have access to quality fresh seafood, buy precooked frozen shrimp or canned fish, such as tuna, salmon, or sardines.  Buy small amounts of raw or cooked vegetables, salads, or olives from the deli or salad bar at your store.  Stock your pantry so you always have certain foods on hand, such as olive oil, canned tuna, canned tomatoes, rice, pasta, and beans. Cooking  Cook foods with extra-virgin olive oil instead of using butter or other vegetable oils.  Have meat as a side dish, and have vegetables or grains as your main dish. This means having meat in small portions or adding small amounts of meat to foods like pasta or stew.  Use beans or vegetables instead of meat in common dishes like chili or lasagna.  Experiment with different cooking methods. Try  roasting or broiling vegetables instead of steaming or sauteing them.  Add frozen vegetables to soups, stews, pasta, or rice.  Add nuts or seeds for added healthy fat at each meal. You can add these to yogurt, salads, or vegetable dishes.  Marinate fish or vegetables using olive oil, lemon juice, garlic, and fresh herbs. Meal planning  Plan to eat 1 vegetarian meal one day each week. Try to work up to 2 vegetarian meals, if possible.  Eat seafood 2 or more times a week.  Have healthy snacks readily available, such as: ? Vegetable sticks with hummus. ? [place] yogurt. ? Fruit and nut trail mix.  Eat balanced meals throughout the week. This includes: ? Fruit: 2-3 servings a day ? Vegetables: 4-5 servings a day ? Low-fat dairy: 2 servings a day ? Fish, poultry, or lean meat: 1 serving a day ? Beans and legumes: 2 or more servings a week ? Nuts and seeds: 1-2 servings a day ? Whole grains: 6-8 servings a day ? Extra-virgin olive oil: 3-4 servings a day  Limit red meat and sweets to only  a few servings a month What are my food choices?  Mediterranean diet ? Recommended ? Grains: Whole-grain pasta. Brown rice. Bulgar wheat. Polenta. Couscous. Whole-wheat bread. [place]. ? Vegetables: Artichokes. Beets. Broccoli. Cabbage. Carrots. Eggplant. Green beans. Chard. Kale. Spinach. Onions. Leeks. Peas. Squash. Tomatoes. Peppers. Radishes. ? Fruits: Apples. Apricots. Avocado. Berries. Bananas. Cherries. Dates. Figs. Grapes. Lemons. Melon. Oranges. Peaches. Plums. Pomegranate. ? Meats and other protein foods: Beans. Almonds. Sunflower seeds. Pine nuts. Peanuts. [place]. Salmon. Scallops. Shrimp. [place]. Tilapia. Clams. Oysters. Eggs. ? Dairy: Low-fat milk. Cheese. Greek yogurt. ? Beverages: Water. Red wine. Herbal tea. ? Fats and oils: Extra virgin olive oil. Avocado oil. Grape seed oil. ? Sweets and desserts: [place] yogurt with honey. Baked apples. Poached pears. Trail mix. ? Seasoning and  other foods: Basil. Cilantro. Coriander. Cumin. Mint. Parsley. Sage. Rosemary. Tarragon. Garlic. Oregano. Thyme. Pepper. Balsalmic vinegar. Tahini. Hummus. Tomato sauce. Olives. Mushrooms. ? Limit these ? Grains: Prepackaged pasta or rice dishes. Prepackaged cereal with added sugar. ? Vegetables: Deep fried potatoes (french fries). ? Fruits: Fruit canned in syrup. ? Meats and other protein foods: Beef. Pork. Lamb. Poultry with skin. Hot dogs. Bernie[place] Salines. ? Dairy: Ice cream. Sour cream. Whole milk. ? Beverages: Juice. Sugar-sweetened soft drinks. Beer. Liquor and spirits. ? Fats and oils: Butter. Canola oil. Vegetable oil. Beef fat (tallow). Lard. ? Sweets and desserts: Cookies. Cakes. Pies. Candy. ? Seasoning and other foods: Mayonnaise. Premade sauces and marinades. ? The items listed may not be a complete list. Talk with your dietitian about what dietary choices are right for you. Summary  The Mediterranean diet includes both food and lifestyle choices.  Eat a variety of fresh fruits and vegetables, beans, nuts, seeds, and whole grains.  Limit the amount of red meat and sweets that you eat.  Talk with your health care provider about whether it is safe for you to drink red wine in moderation. This means 1 glass a day for nonpregnant women and 2 glasses a day for men. A glass of wine equals 5 oz (150 mL). This information is not intended to replace advice given to you by your health care provider. Make sure you discuss any questions you have with your health care provider. Document Released: 10/08/2015 Document Revised: 11/10/2015 Document Reviewed: 10/08/2015 Elsevier Interactive Patient Education  2018 [place] Prediabetes-also called impaired glucose tolerance or impaired fasting glucose-is a condition that causes blood sugar (blood glucose) levels to be higher than normal. Following a healthy diet can help to keep prediabetes under control. It can also help to  lower the risk of type 2 diabetes and heart disease, which are increased in people who have prediabetes. Along with regular exercise, a healthy diet:  Promotes weight loss.  Helps to control blood sugar levels.  Helps to improve the way that the body uses insulin.  What do I need to know about this eating plan?  Use the glycemic index (GI) to plan your meals. The index tells you how quickly a food will raise your blood sugar. Choose low-GI foods. These foods take a longer time to raise blood sugar.  Pay close attention to the amount of carbohydrates in the food that you eat. Carbohydrates increase blood sugar levels.  Keep track of how many calories you take in. Eating the right amount of calories will help you to achieve a healthy weight. Losing about 7 percent of your starting weight can help to prevent type 2 diabetes.  You may want  to follow a Mediterranean diet. This diet includes a lot of vegetables, lean meats or fish, whole grains, fruits, and healthy oils and fats. What foods can I eat? Grains Whole grains, such as whole-wheat or whole-grain breads, crackers, cereals, and pasta. Unsweetened oatmeal. Bulgur. Barley. Quinoa. Brown rice. Corn or whole-wheat flour tortillas or taco shells. Vegetables Lettuce. Spinach. Peas. Beets. Cauliflower. Cabbage. Broccoli. Carrots. Tomatoes. Squash. Eggplant. Herbs. Peppers. Onions. Cucumbers. Brussels sprouts. Fruits Berries. Bananas. Apples. Oranges. Grapes. Papaya. Mango. Pomegranate. Kiwi. Grapefruit. Cherries. Meats and Other Protein Sources Seafood. Lean meats, such as chicken and [place] or lean cuts of pork and beef. Tofu. Eggs. Nuts. Beans. Dairy Low-fat or fat-free dairy products, such as yogurt, cottage cheese, and cheese. Beverages Water. Tea. Coffee. Sugar-free or diet soda. Seltzer water. Milk. Milk alternatives, such as soy or almond milk. Condiments Mustard. Relish. Low-fat, low-sugar ketchup. Low-fat, low-sugar barbecue  sauce. Low-fat or fat-free mayonnaise. Sweets and Desserts Sugar-free or low-fat pudding. Sugar-free or low-fat ice cream and other frozen treats. Fats and Oils Avocado. Walnuts. Olive oil. The items listed above may not be a complete list of recommended foods or beverages. Contact your dietitian for more options. What foods are not recommended? Grains Refined white flour and flour products, such as bread, pasta, snack foods, and cereals. Beverages Sweetened drinks, such as sweet iced tea and soda. Sweets and Desserts Baked goods, such as cake, cupcakes, pastries, cookies, and cheesecake. The items listed above may not be a complete list of foods and beverages to avoid. Contact your dietitian for more information. This information is not intended to replace advice given to you by your health care provider. Make sure you discuss any questions you have with your health care provider. Document Released: 07/01/2014 Document Revised: 07/23/2015 Document Reviewed: 03/12/2014 Elsevier Interactive Patient Education  2017 [employer].

## 2017-02-16 NOTE — Progress Notes (Signed)
Subjective:    Patient ID: Nathaniel Mosley, male    DOB: [birth year], 56 y.o.   MRN: [id number] Chief Complaint  Patient presents with  . Medication Refill    Norvasc    HPI  Walking a lot at work - works at [employer] so can get >20K steps/d fairly easily, less caffeine, more water.  Not checking BP at work  Past Medical History:  Diagnosis Date  . Allergy   . Hypertension   . Kidney stone on left side    Past Surgical History:  Procedure Laterality Date  . FRACTURE SURGERY    . OPEN REPAIR PERIARTICULAR FRACTURE / DISLOCATION ELBOW  1998  . PLANTAR FASCIA RELEASE  2013   No current outpatient medications on file prior to visit.   No current facility-administered medications on file prior to visit.    Allergies  Allergen Reactions  . Azithromycin Other (See Comments)    GI upset  . Erythromycin    Family History  Problem Relation Age of Onset  . Cancer Mother   . Diabetes Father    Social History   Socioeconomic History  . Marital status: Single    Spouse name: None  . Number of children: None  . Years of education: None  . Highest education level: None  Social Needs  . Financial resource strain: None  . Food insecurity - worry: None  . Food insecurity - inability: None  . Transportation needs - medical: None  . Transportation needs - non-medical: None  Occupational History  . None  Tobacco Use  . Smoking status: Never Smoker  . Smokeless tobacco: Never Used  Substance and Sexual Activity  . Alcohol use: No  . Drug use: No  . Sexual activity: No    Birth control/protection: Abstinence  Other Topics Concern  . None  Social History Narrative  . None   Depression screen [facility] 2/9 02/16/2017 06/06/2016 01/19/2016 12/09/2015 09/03/2015  Decreased Interest 0 0 0 0 0  Down, Depressed, Hopeless 0 0 0 0 0  PHQ - 2 Score 0 0 0 0 0    Review of Systems  Constitutional: Negative for chills and fever.  Eyes: Negative for visual disturbance.    Respiratory: Negative for shortness of breath.   Cardiovascular: Negative for chest pain and leg swelling.  Neurological: Negative for dizziness, syncope, facial asymmetry, weakness, light-headedness and headaches.       Objective:   Physical Exam  Constitutional: He is oriented to person, place, and time. He appears well-developed and well-nourished. No distress.  HENT:  Head: Normocephalic and atraumatic.  Eyes: Conjunctivae are normal. Pupils are equal, round, and reactive to light. No scleral icterus.  Neck: Normal range of motion. Neck supple. No thyromegaly present.  Cardiovascular: Normal rate, regular rhythm, normal heart sounds and intact distal pulses.  Pulmonary/Chest: Effort normal and breath sounds normal. No respiratory distress.  Musculoskeletal: He exhibits no edema.  Lymphadenopathy:    He has no cervical adenopathy.  Neurological: He is alert and oriented to person, place, and time.  Skin: Skin is warm and dry. He is not diaphoretic.  Psychiatric: He has a normal mood and affect. His behavior is normal.      BP 128/78   Pulse 86   Temp 98.9 F (37.2 C)   Resp 16   Ht 5\' 6"  (1.676 m)   Wt 225 lb (102.1 kg)   SpO2 94%   BMI 36.32 kg/m      Assessment &  Plan:   1. Essential hypertension - never increased amlodipine to 10mg  as was prev instructed but BP looks great today on 5mg  so continue.  2. Class 2 obesity due to excess calories without serious comorbidity with body mass index (BMI) of 36.0 to 36.9 in adult   3. Metabolic syndrome X   4. Prediabetes  Lab Results  Component Value Date   HGBA1C 6.4 (H) 06/06/2016      Declines labs today - will do at CPE in 6 mos.  Needs a1c recheck - reviewed DM/low carb diet.  Meds ordered this encounter  Medications  . amLODipine (NORVASC) 5 MG tablet    Sig: Take 1 tablet (5 mg total) by mouth daily.    Dispense:  90 tablet    Refill:  1    Delman Cheadle, M.D.  Primary Care at [facility] [address] [place], [place] [address] [phone] phone [phone] fax  02/19/17 6:45 AM

## 2017-02-17 ENCOUNTER — Other Ambulatory Visit: Payer: Self-pay | Admitting: Physician Assistant

## 2017-02-17 NOTE — Telephone Encounter (Signed)
LOV 02/16/17 with Dr. Brigitte Pulse / See refill request for ketoconazole cream

## 2017-07-13 DIAGNOSIS — M79676 Pain in unspecified toe(s): Secondary | ICD-10-CM

## 2017-07-23 DIAGNOSIS — H40033 Anatomical narrow angle, bilateral: Secondary | ICD-10-CM | POA: Diagnosis not present

## 2017-07-23 DIAGNOSIS — H04123 Dry eye syndrome of bilateral lacrimal glands: Secondary | ICD-10-CM | POA: Diagnosis not present

## 2017-07-28 DIAGNOSIS — M79676 Pain in unspecified toe(s): Secondary | ICD-10-CM

## 2017-08-03 ENCOUNTER — Other Ambulatory Visit: Payer: Self-pay | Admitting: Physician Assistant

## 2017-09-04 ENCOUNTER — Other Ambulatory Visit: Payer: Self-pay | Admitting: Family Medicine

## 2017-09-04 DIAGNOSIS — I10 Essential (primary) hypertension: Secondary | ICD-10-CM

## 2017-10-10 ENCOUNTER — Other Ambulatory Visit: Payer: Self-pay | Admitting: Family Medicine

## 2017-10-10 DIAGNOSIS — I10 Essential (primary) hypertension: Secondary | ICD-10-CM

## 2017-10-10 NOTE — Telephone Encounter (Signed)
Contacted pt regarding refill for amlodipine; last office visit was 02/16/17; explained the need for office visit for additional refills; he verbalizes understanding and schedules appointment with Dr Delman Cheadle, [address], on 11/10/17 at 1120.

## 2017-11-02 ENCOUNTER — Telehealth: Payer: Self-pay | Admitting: Family Medicine

## 2017-11-02 NOTE — Telephone Encounter (Signed)
Spoke with patient. Will call back to reschedule his appt from 11/10/2017 with Dr. Brigitte Pulse.

## 2017-11-06 ENCOUNTER — Other Ambulatory Visit: Payer: Self-pay | Admitting: Family Medicine

## 2017-11-06 DIAGNOSIS — I10 Essential (primary) hypertension: Secondary | ICD-10-CM

## 2017-11-10 ENCOUNTER — Telehealth: Payer: Self-pay | Admitting: Family Medicine

## 2017-11-10 ENCOUNTER — Ambulatory Visit: Payer: Self-pay | Admitting: Family Medicine

## 2017-11-10 NOTE — Telephone Encounter (Signed)
Patient is needing a refill of his medication amlodipine. The patient was scheduled with Dr. Brigitte Pulse 11/10/2017 then rescheduled for 12/18/2017. But will call back to schedule with Dr. Brigitte Pulse in November.

## 2017-11-14 ENCOUNTER — Other Ambulatory Visit: Payer: Self-pay

## 2017-11-14 DIAGNOSIS — I10 Essential (primary) hypertension: Secondary | ICD-10-CM

## 2017-11-14 MED ORDER — AMLODIPINE BESYLATE 5 MG PO TABS: 5.0000 mg | ORAL_TABLET | Freq: Every day | ORAL | 0 refills | Status: DC

## 2017-11-14 NOTE — Telephone Encounter (Signed)
Rx sent in for one month

## 2017-12-18 ENCOUNTER — Ambulatory Visit: Payer: Self-pay | Admitting: Family Medicine

## 2018-01-04 ENCOUNTER — Other Ambulatory Visit: Payer: Self-pay | Admitting: Family Medicine

## 2018-01-04 DIAGNOSIS — I10 Essential (primary) hypertension: Secondary | ICD-10-CM

## 2018-01-04 NOTE — Telephone Encounter (Signed)
Requested Prescriptions  Pending Prescriptions Disp Refills  . amLODipine (NORVASC) 5 MG tablet [Pharmacy Med Name: AMLODIPINE BESYLATE 5 MG TAB] 30 tablet 0    Sig: TAKE 1 TABLET BY MOUTH EVERY DAY     Cardiovascular:  Calcium Channel Blockers Failed - 01/04/2018  4:23 AM      Failed - Valid encounter within last 6 months    Recent Outpatient Visits          10 months ago Essential hypertension   [facility] at [facility], Laurey Arrow, MD   1 year ago Annual physical exam   [facility], Laurey Arrow, MD   2 years ago Essential hypertension   [facility] at [facility], Laurey Arrow, MD   2 years ago Annual physical exam   Primary Care at [place], [place]   3 years ago Acute recurrent frontal sinusitis   Primary Care at [facility], Gay Filler, MD      Future Appointments            In 1 week Shawnee Knapp, MD Primary Care at [place], [place] BP in normal range    BP Readings from Last 1 Encounters:  02/16/17 128/78

## 2018-01-12 ENCOUNTER — Ambulatory Visit (INDEPENDENT_AMBULATORY_CARE_PROVIDER_SITE_OTHER): Payer: 59 | Admitting: Family Medicine

## 2018-01-12 ENCOUNTER — Encounter: Payer: Self-pay | Admitting: Family Medicine

## 2018-01-12 VITALS — BP 126/86 | HR 84 | Temp 99.0°F | Resp 16 | Ht 65.75 in | Wt 214.8 lb

## 2018-01-12 DIAGNOSIS — I10 Essential (primary) hypertension: Secondary | ICD-10-CM | POA: Diagnosis not present

## 2018-01-12 DIAGNOSIS — Z0001 Encounter for general adult medical examination with abnormal findings: Secondary | ICD-10-CM

## 2018-01-12 DIAGNOSIS — E782 Mixed hyperlipidemia: Secondary | ICD-10-CM

## 2018-01-12 DIAGNOSIS — E8881 Metabolic syndrome: Secondary | ICD-10-CM | POA: Diagnosis not present

## 2018-01-12 DIAGNOSIS — Z125 Encounter for screening for malignant neoplasm of prostate: Secondary | ICD-10-CM

## 2018-01-12 DIAGNOSIS — E6609 Other obesity due to excess calories: Secondary | ICD-10-CM

## 2018-01-12 DIAGNOSIS — Z1212 Encounter for screening for malignant neoplasm of rectum: Secondary | ICD-10-CM

## 2018-01-12 DIAGNOSIS — Z0000 Encounter for general adult medical examination without abnormal findings: Secondary | ICD-10-CM

## 2018-01-12 DIAGNOSIS — Z6836 Body mass index (BMI) 36.0-36.9, adult: Secondary | ICD-10-CM

## 2018-01-12 DIAGNOSIS — K429 Umbilical hernia without obstruction or gangrene: Secondary | ICD-10-CM

## 2018-01-12 DIAGNOSIS — R7303 Prediabetes: Secondary | ICD-10-CM

## 2018-01-12 DIAGNOSIS — Z1211 Encounter for screening for malignant neoplasm of colon: Secondary | ICD-10-CM | POA: Diagnosis not present

## 2018-01-12 LAB — POCT URINALYSIS DIP (MANUAL ENTRY)
Bilirubin, UA: NEGATIVE
Blood, UA: NEGATIVE
Glucose, UA: NEGATIVE mg/dL
Ketones, POC UA: NEGATIVE mg/dL
LEUKOCYTES UA: NEGATIVE
NITRITE UA: NEGATIVE
PH UA: 7 (ref 5.0–8.0)
Spec Grav, UA: 1.015 (ref 1.010–1.025)
UROBILINOGEN UA: 0.2 U/dL

## 2018-01-12 LAB — POCT GLYCOSYLATED HEMOGLOBIN (HGB A1C): Hemoglobin A1C: 5.8 % — AB (ref 4.0–5.6)

## 2018-01-12 MED ORDER — AMLODIPINE BESYLATE 5 MG PO TABS: 5.0000 mg | ORAL_TABLET | Freq: Every day | ORAL | 4 refills | Status: DC

## 2018-01-12 NOTE — Progress Notes (Signed)
Subjective:    Patient ID: Nathaniel Mosley; male   DOB: 11-11-1960; 57 y.o.   MRN: [id number]  Chief Complaint  Patient presents with  . Annual Exam    HPI  I last saw Nathaniel Mosley 1 yr prev for med refills. His last CPE was 05/2016. Primary Preventative Screenings: Prostate Cancer: 05/2016 psa 0.7 STI screening: neg hiv/hep C 05/2016, declines need as single and not sexually active. Colorectal Cancer: due for repeat as last was 12/2012 w/ 5 yr recall - saw Dr. 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  Immunization History  Administered Date(s) Administered  . Influenza-Unspecified 11/29/2014  . Td 12/16/2010    Chronic Medical Conditions: HTN: On amlodipine 5 Obesity: After kidneys stone 18 mos ago he stopped drinking sweet tea entirely which he had been drinking A LOT of prior Pre-DM: last a1c 2 yrs ago 6.4 HLD last lipids 05/2016 LDL 124, hdl 30, trig 212, non-hdl 166 had worsened from prior  Medical History: Past Medical History:  Diagnosis Date  . Allergy   . Hypertension   . Kidney stone on left side    Past Surgical History:  Procedure Laterality Date  . FRACTURE SURGERY    . OPEN REPAIR PERIARTICULAR FRACTURE / DISLOCATION ELBOW  1998  . PLANTAR FASCIA RELEASE  2013   Current Outpatient Medications on File Prior to Visit  Medication Sig Dispense Refill  . amLODipine (NORVASC) 5 MG tablet  TAKE 1 TABLET BY MOUTH EVERY DAY 30 tablet 0  . ketoconazole (NIZORAL) 2 % cream APPLY EVERY DAY 60 g 0   No current facility-administered medications on file prior to visit.    Allergies  Allergen Reactions  . Azithromycin Other (See Comments)    GI upset  . Erythromycin    Family History  Problem Relation Age of Onset  . Cancer Mother   . Diabetes Father    Social History   Socioeconomic History  . Marital status: Single    Spouse name: Not on file  . Number of children: Not on file  . Years of education: Not on file  . Highest education level: Not on file  Occupational History  . Not on file  Social Needs  . Financial resource strain: Not on file  . Food insecurity:    Worry: Not on file    Inability: Not on file  . Transportation needs:    Medical: Not on file    Non-medical: Not on file  Tobacco Use  . Smoking status: Never Smoker  . Smokeless tobacco: Never Used  Substance and Sexual Activity  . Alcohol use: No  . Drug use: No  . Sexual activity: Never    Birth control/protection: Abstinence  Lifestyle  . Physical activity:    Days per week: Not on file    Minutes per session: Not on file  . Stress: Not on file  Relationships  . Social connections:    Talks  on phone: Not on file    Gets together: Not on file    Attends religious service: Not on file    Active member of club or organization: Not on file    Attends meetings of clubs or organizations: Not on file    Relationship status: Not on file  Other Topics Concern  . Not on file  Social History Narrative  . Not on file  Patient is single, lives on his own. He does not have any children. Denies smoking cigarettes or alcohol use. Works in Par[employer]. Tries to exercise regularly  Depression screen [facility] 2/9 01/12/2018 02/16/2017 06/06/2016 01/19/2016 12/09/2015  Decreased Interest 0 0 0 0 0  Down, Depressed, Hopeless 0 0 0 0 0  PHQ - 2 Score 0 0 0 0 0     Review of Systems    Endo/Heme/Allergies: Positive for environmental allergies.  All other systems reviewed and are negative. Otherwise as noted in HPI     Objective:  BP 126/86 (BP Location: Left Arm, Patient Position: Sitting, Cuff Size: Large)   Pulse 84   Temp 99 F (37.2 C) (Oral)   Resp 16   Ht 5' 5.75" (1.67 m)   Wt 214 lb 12.8 oz (97.4 kg)   SpO2 94%   BMI 34.93 kg/m   Visual Acuity Screening   Right eye Left eye Both eyes  Without correction:     With correction: 20/15 20/15 20/15    Physical Exam  Constitutional: He is oriented to person, place, and time. He appears well-developed and well-nourished. No distress.  HENT:  Head: Normocephalic and atraumatic.  Right Ear: Tympanic membrane, external ear and ear canal normal.  Left Ear: Tympanic membrane, external ear and ear canal normal.  Nose: Nose normal.  Mouth/Throat: Uvula is midline, oropharynx is clear and moist and mucous membranes are normal. No oropharyngeal exudate.  Eyes: Conjunctivae are normal. Right eye exhibits no discharge. Left eye exhibits no discharge. No scleral icterus.  Neck: Normal range of motion. Neck supple. No thyromegaly present.  Cardiovascular: Normal rate, regular rhythm, normal heart sounds and intact distal pulses.  Pulmonary/Chest: Effort normal and breath sounds normal. No respiratory distress.  Abdominal: Soft. Bowel sounds are normal. He exhibits no distension and no mass. There is no tenderness. There is no rebound and no guarding. A hernia (umbilical ~9-8 cm dm) is present.  Genitourinary: Rectum normal and prostate normal. Prostate is not enlarged and not tender.  Musculoskeletal: He exhibits no edema.  Lymphadenopathy:    He has no cervical adenopathy.  Neurological: He is alert and oriented to person, place, and time. He has normal reflexes. He displays normal reflexes. No cranial nerve deficit. He exhibits normal muscle tone.  Skin: Skin is warm and dry. No rash noted. He is not diaphoretic. No  erythema.  Psychiatric: He has a normal mood and affect. His behavior is normal.         [facility] Office Visit on 01/12/2018  Component Date Value Ref Range Status  . Hemoglobin A1C 01/12/2018 5.8* 4.0 - 5.6 % Final  . Color, UA 01/12/2018 yellow  yellow Final  . Clarity, UA 01/12/2018 clear  clear Final  . Glucose, UA 01/12/2018 negative  negative mg/dL Final  . Bilirubin, UA 01/12/2018 negative  negative Final  . Ketones, POC UA 01/12/2018 negative  negative mg/dL Final  . Spec Grav, UA 01/12/2018 1.015  1.010 - 1.025 Final  . Blood, UA 01/12/2018 negative  negative Final  . pH,  UA 01/12/2018 7.0  5.0 - 8.0 Final  . Protein Ur, POC 01/12/2018 trace* negative mg/dL Final  . Urobilinogen, UA 01/12/2018 0.2  0.2 or 1.0 E.U./dL Final  . Nitrite, UA 01/12/2018 Negative  Negative Final  . Leukocytes, UA 01/12/2018 Negative  Negative Final     Assessment & Plan:   1. Annual physical exam - Flu shot done at work  2. Essential hypertension - well controlled on amlodipine 5 - cont  3. Class 2 obesity due to excess calories without serious comorbidity with body mass index (BMI) of 36.0 to 36.9 in adult   4. Prediabetes  - over past yr cut out sweet tea to a1c 6.4 -> 5.8, congrats and reviewed options for further change to diet to get a1c back into normal range  5. Metabolic syndrome X - improving  6. Screening for prostate cancer   7. Mixed hyperlipidemia   8.      Screening for colorectal cancer - Called Dr. Lorie Apley office and had copy of prior faxed over and will be scanned into Media tab - was done 01/10/13 with 6 mm sessile serrated adenoma/polyps in descending colon, 28mm inflammatory-type polyp in cecum, and hyperplastic polyp in rectum x 3. Due for repeat colonoscopy - placed referral back to Dr. Collene Mares. 9.      Umbilical hernia without obstruction and without gangrene - small but annoying to Nathaniel Mosley so he would like surgery referral for consultation eval at some point -he will consider  it next summer and call for referral when desired.   Patient will continue on current chronic medications other than changes noted above, so ok to refill when needed.   Reviewed all health maintenance recommendations per USPSTF guidelines.   See after visit summary for patient specific instructions.  Orders Placed This Encounter  Procedures  . Comprehensive metabolic panel    Order Specific Question:   Has the patient fasted?    Answer:   Yes  . Lipid panel    Order Specific Question:   Has the patient fasted?    Answer:   Yes  . Microalbumin/Creatinine Ratio, Urine  . TSH  . PSA  . CBC with Differential/Platelet  . Ambulatory referral to Gastroenterology    Referral Priority:   Routine    Referral Type:   Consultation    Referral Reason:   Specialty Services Required    Referred to Provider:   Juanita Craver, MD    Number of Visits Requested:   1  . POCT glycosylated hemoglobin (Hb A1C)  . POCT urinalysis dipstick     Meds ordered this encounter  Medications  . amLODipine (NORVASC) 5 MG tablet    Sig: Take 1 tablet (5 mg total) by mouth daily.    Dispense:  90 tablet    Refill:  4    Patient verbalized to me that they understand the following: diagnosis, what is being done for them, what to expect and what should be done at home.  Their questions have been answered. They understand that I am unable to predict every possible medication interaction or adverse outcome and that if any unexpected symptoms arise, they should contact us and their pharmacist, as well as never hesitate to seek urgent/emergent care at [facility] or ER if they think it might be warranted.    Delman Cheadle, MD, MPH [facility] [address] [place], [place]  [phone] Office phone  [phone] Office  fax   01/12/18 1:41 AM

## 2018-01-12 NOTE — Patient Instructions (Addendum)
You have pre-diabetes and are VERY close to getting rid of it and getting your blood sugar completely back to normal.  Your hemoglobin a1c (an average of all of your blood sugars over the past 3 months) is 5.8 and 5.6 or <  is normal and no longer pre-DM.Try to increase exercise and decrease carbohydrates in your diet - especially trying to eliminate white starches such as potatoes (sweet potatoes are ok), rice, bread, and pasta.  Eat less sugar.  Use whole grain or wheat products whenever available and try to reduce all carbohydrates to <25% of your diet.  Whenever you eat fruit, make sure it is the whole fruit (like in a smoothie) so you get fiber of the fruit (unlike in a juice where you just get the sugar of the fruit). When you eat fruit, try to choose more low glycemic index fruits (cherries, grapefruit, apricots, pears, apples, oranges, plums, strawberries, peaches, and grapes are some of the better ones for blood sugar.)    If you have lab work done today you will be contacted with your lab results within the next 2 weeks.  If you have not heard from [place] then please contact us. The fastest way to get your results is to register for My Chart.   IF you received an x-ray today, you will receive an invoice from [facility]. Please contact [facility] at [phone] with questions or concerns regarding your invoice.   IF you received labwork today, you will receive an invoice from [facility]. Please contact [facility] at [phone] with questions or concerns regarding your invoice.   Our billing staff will not be able to assist you with questions regarding bills from these companies.  You will be contacted with the lab results as soon as they are available. The fastest way to get your results is to activate your My Chart account. Instructions are located on the last page of this paperwork. If you have not heard from [place] regarding the results in 2 weeks, please contact this office.     Prediabetes Eating Plan Prediabetes-also called impaired glucose tolerance or impaired fasting glucose-is a condition that causes blood sugar (blood glucose) levels to be higher than normal. Following a healthy diet can help to keep prediabetes under control. It can also help to lower the risk of type 2 diabetes and heart disease, which are increased in people who have prediabetes. Along with regular exercise, a healthy diet:  Promotes weight loss.  Helps to control blood sugar levels.  Helps to improve the way that the body uses insulin.  What do I need to know about this eating plan?  Use the glycemic index (GI) to plan your meals. The index tells you how quickly a food will raise your blood sugar. Choose low-GI foods. These foods take a longer time to raise blood sugar.  Pay close attention to the amount of carbohydrates in the food that you eat. Carbohydrates increase blood sugar levels.  Keep track of how many calories you take in. Eating the right amount of calories will help you to achieve a healthy weight. Losing about 7 percent of your starting weight can help to prevent type 2 diabetes.  You may want to follow a Mediterranean diet. This diet includes a lot of vegetables, lean meats or fish, whole grains, fruits, and healthy oils and fats. What foods can I eat? Grains Whole grains, such as whole-wheat or whole-grain breads, crackers, cereals, and pasta. Unsweetened oatmeal. Bulgur. Barley. Quinoa. Brown rice.  Corn or whole-wheat flour tortillas or taco shells. Vegetables Lettuce. Spinach. Peas. Beets. Cauliflower. Cabbage. Broccoli. Carrots. Tomatoes. Squash. Eggplant. Herbs. Peppers. Onions. Cucumbers. Brussels sprouts. Fruits Berries. Bananas. Apples. Oranges. Grapes. Papaya. Mango. Pomegranate. Kiwi. Grapefruit. Cherries. Meats and Other Protein Sources Seafood. Lean meats, such as chicken and [place] or lean cuts of pork and beef. Tofu. Eggs. Nuts. Beans. Dairy Low-fat or  fat-free dairy products, such as yogurt, cottage cheese, and cheese. Beverages Water. Tea. Coffee. Sugar-free or diet soda. Seltzer water. Milk. Milk alternatives, such as soy or almond milk. Condiments Mustard. Relish. Low-fat, low-sugar ketchup. Low-fat, low-sugar barbecue sauce. Low-fat or fat-free mayonnaise. Sweets and Desserts Sugar-free or low-fat pudding. Sugar-free or low-fat ice cream and other frozen treats. Fats and Oils Avocado. Walnuts. Olive oil. The items listed above may not be a complete list of recommended foods or beverages. Contact your dietitian for more options. What foods are not recommended? Grains Refined white flour and flour products, such as bread, pasta, snack foods, and cereals. Beverages Sweetened drinks, such as sweet iced tea and soda. Sweets and Desserts Baked goods, such as cake, cupcakes, pastries, cookies, and cheesecake. The items listed above may not be a complete list of foods and beverages to avoid. Contact your dietitian for more information. This information is not intended to replace advice given to you by your health care provider. Make sure you discuss any questions you have with your health care provider. Document Released: 07/01/2014 Document Revised: 07/23/2015 Document Reviewed: 03/12/2014 Elsevier Interactive Patient Education  2017 Elsevier Inc.  Preventing Type 2 Diabetes Mellitus Type 2 diabetes (type 2 diabetes mellitus) is a long-term (chronic) disease that affects blood sugar (glucose) levels. Normally, a hormone called insulin allows glucose to enter cells in the body. The cells use glucose for energy. In type 2 diabetes, one or both of these problems may be present:  The body does not make enough insulin.  The body does not respond properly to insulin that it makes (insulin resistance).  Insulin resistance or lack of insulin causes excess glucose to build up in the blood instead of going into cells. As a result, high blood  glucose (hyperglycemia) develops, which can cause many complications. Being overweight or obese and having an inactive (sedentary) lifestyle can increase your risk for diabetes. Type 2 diabetes can be delayed or prevented by making certain nutrition and lifestyle changes. What nutrition changes can be made?  Eat healthy meals and snacks regularly. Keep a healthy snack with you for when you get hungry between meals, such as fruit or a handful of nuts.  Eat lean meats and proteins that are low in saturated fats, such as chicken, fish, egg whites, and beans. Avoid processed meats.  Eat plenty of fruits and vegetables and plenty of grains that have not been processed (whole grains). It is recommended that you eat: ? 1?2 cups of fruit every day. ? 2?3 cups of vegetables every day. ? 6?8 oz of whole grains every day, such as oats, whole wheat, bulgur, brown rice, quinoa, and millet.  Eat low-fat dairy products, such as milk, yogurt, and cheese.  Eat foods that contain healthy fats, such as nuts, avocado, olive oil, and canola oil.  Drink water throughout the day. Avoid drinks that contain added sugar, such as soda or sweet tea.  Follow instructions from your health care provider about specific eating or drinking restrictions.  Control how much food you eat at a time (portion size). ? Check food labels to find out  the serving sizes of foods. ? Use a kitchen scale to weigh amounts of foods.  Saute or steam food instead of frying it. Cook with water or broth instead of oils or butter.  Limit your intake of: ? Salt (sodium). Have no more than 1 tsp (2,400 mg) of sodium a day. If you have heart disease or high blood pressure, have less than ? tsp (1,500 mg) of sodium a day. ? Saturated fat. This is fat that is solid at room temperature, such as butter or fat on meat. What lifestyle changes can be made?  Activity  Do moderate-intensity physical activity for at least 30 minutes on at least 5  days of the week, or as much as told by your health care provider.  Ask your health care provider what activities are safe for you. A mix of physical activities may be best, such as walking, swimming, cycling, and strength training.  Try to add physical activity into your day. For example: ? Park in spots that are farther away than usual, so that you walk more. For example, park in a far corner of the parking lot when you go to the office or the grocery store. ? Take a walk during your lunch break. ? Use stairs instead of elevators or escalators. Weight Loss  Lose weight as directed. Your health care provider can determine how much weight loss is best for you and can help you lose weight safely.  If you are overweight or obese, you may be instructed to lose at least 5?7 % of your body weight. Alcohol and Tobacco   Limit alcohol intake to no more than 1 drink a day for nonpregnant women and 2 drinks a day for men. One drink equals 12 oz of beer, 5 oz of wine, or 1 oz of hard liquor.  Do not use any tobacco products, such as cigarettes, chewing tobacco, and e-cigarettes. If you need help quitting, ask your health care provider. Work With [place] Provider  Have your blood glucose tested regularly, as told by your health care provider.  Discuss your risk factors and how you can reduce your risk for diabetes.  Get screening tests as told by your health care provider. You may have screening tests regularly, especially if you have certain risk factors for type 2 diabetes.  Make an appointment with a diet and nutrition specialist (registered dietitian). A registered dietitian can help you make a healthy eating plan and can help you understand portion sizes and food labels. Why are these changes important?  It is possible to prevent or delay type 2 diabetes and related health problems by making lifestyle and nutrition changes.  It can be difficult to recognize signs of type 2  diabetes. The best way to avoid possible damage to your body is to take actions to prevent the disease before you develop symptoms. What can happen if changes are not made?  Your blood glucose levels may keep increasing. Having high blood glucose for a long time is dangerous. Too much glucose in your blood can damage your blood vessels, heart, kidneys, nerves, and eyes.  You may develop prediabetes or type 2 diabetes. Type 2 diabetes can lead to many chronic health problems and complications, such as: ? Heart disease. ? Stroke. ? Blindness. ? Kidney disease. ? Depression. ? Poor circulation in the feet and legs, which could lead to surgical removal (amputation) in severe cases. Where to find support:  Ask your health care provider to recommend  a Firefighter, diabetes educator, or weight loss program.  Look for local or online weight loss groups.  Join a gym, fitness club, or outdoor activity group, such as a walking club. Where to find more information: To learn more about diabetes and diabetes prevention, visit:  American Diabetes Association (ADA): www.diabetes.[employer] of Diabetes and Digestive and Kidney Diseases: FindSpin.nl  To learn more about healthy eating, visit:  The U.S. Department of Agriculture Scientist, research (physical sciences)), Choose My Plate: http://wiley-williams.com/  Office of Disease Prevention and Health Promotion (ODPHP), Dietary Guidelines: SurferLive.at  Summary  You can reduce your risk for type 2 diabetes by increasing your physical activity, eating healthy foods, and losing weight as directed.  Talk with your health care provider about your risk for type 2 diabetes. Ask about any blood tests or screening tests that you need to have. This information is not intended to replace advice given to you by your health care provider. Make sure you discuss any questions you have with your health care  provider. Document Released: 06/08/2015 Document Revised: 07/23/2015 Document Reviewed: 04/07/2015 Elsevier Interactive Patient Education  2018 [employer].  Metabolic Syndrome Metabolic syndrome is the presence of at least three factors that increase your risk of getting cardiovascular disease and diabetes. These factors are:  High blood sugar.  High blood triglyceride level.  High blood pressure.  Low levels of good blood cholesterol (high-density lipoprotein or HDL).  Excess weight around the waist. This factor is present with a waist measurement of: ? More than 40 inches in men. ? More than 35 inches in women.  Metabolic syndrome is sometimes called insulin resistance syndrome and syndrome X. What are the causes? The exact cause is not known, but genetics and lifestyle choices play a role. What increases the risk? You are more likely to develop metabolic syndrome if:  You eat a diet high in calories and saturated fat.  You do not exercise regularly.  You are overweight.  You have a family history of metabolic syndrome.  You are Asian.  You are older in age.  You have insulin resistance.  You use any tobacco products, including cigarettes, chewing tobacco, or electronic cigarettes.  What are the signs or symptoms? Metabolic syndrome has no specific symptoms. How is this diagnosed? To make a diagnosis, your health care provider will determine whether you have at least three of the factors that make up metabolic syndrome by:  Taking your blood pressure.  Measuring your waist.  Ordering blood tests.  How is this treated? Treatment may include:  Lifestyle changes to reduce your risk for heart disease and stroke, such as: ? Exercise. ? Weight loss. ? Maintaining a healthy diet. ? Quitting the use of any tobacco products, including cigarettes, chewing tobacco, or electronic cigarettes.  Medicines that: ? Help your body to maintain glucose  control. ? Reduce your blood pressure and your blood triglyceride levels.  Follow these instructions at home:  Exercise regularly.  Maintain a healthy diet.  Do not use any tobacco products, including cigarettes, chewing tobacco, or electronic cigarettes. If you need help quitting, ask your health care provider.  Keep all follow-up visits as directed by your health care provider. This is important.  Measure your waist regularly and record the measurement. To measure your waist: ? Stand up straight. ? Breathe out. ? Wrap the measuring tape around the part of your waist that is just above your hipbones. ? Read the measurement. Contact a health care provider if:  You  feel very tired.  You develop excessive thirst.  You pass large quantities of urine.  You put on weight around your waist.  You have headaches over and over again.  You have a dizzy spell. Get help right away if:  You develop sudden blurred vision.  You develop a sudden dizzy spell.  You have sudden trouble speaking or swallowing.  You have sudden weakness in your arm or leg.  You have chest pains or trouble breathing.  You feel like your heartbeat is abnormal.  You faint. This information is not intended to replace advice given to you by your health care provider. Make sure you discuss any questions you have with your health care provider. Document Released: 05/24/2007 Document Revised: 07/23/2015 Document Reviewed: 09/20/2013 Elsevier Interactive Patient Education  2018 [employer]. Diet for Metabolic Syndrome Metabolic syndrome is a disorder that includes at least three of these conditions:  Abdominal obesity.  Too much sugar in your blood.  High blood pressure.  Higher than normal amount of fat (lipids) in your blood.  Lower than normal level of "good" cholesterol (HDL).  Following a healthy diet can help to keep metabolic syndrome under control. It can also help to prevent the development  of conditions that are associated with metabolic syndrome, such as diabetes, heart disease, and stroke. Along with exercise, a healthy diet:  Helps to improve the way that the body uses insulin.  Promotes weight loss. A common goal for people with this condition is to lose at least 7 to 10 percent of their starting weight.  What do I need to know about this diet?  Use the glycemic index (GI) to plan your meals. The index tells you how quickly a food will raise your blood sugar. Choose foods that have low GI values. These foods take a longer time to raise blood sugar.  Keep track of how many calories you take in. Eating the right amount of calories will help your achieve a healthy weight.  You may want to follow a Mediterranean diet. This diet includes lots of vegetables, lean meats or fish, whole grains, fruits, and healthy oils and fats. What foods can I eat? Grains Stone-ground whole wheat. Pumpernickel bread. Whole-grain bread, crackers, tortillas, cereal, and pasta. Unsweetened oatmeal.Bulgur.Barley.Quinoa.Brown rice or wild rice. Vegetables Lettuce. Spinach. Peas. Beets. Cauliflower. Cabbage. Broccoli. Carrots. Tomatoes. Squash. Eggplant. Herbs. Peppers. Onions. Cucumbers. Brussels sprouts. Sweet potatoes. Yams. Beans. Lentils. Fruits Berries. Apples. Oranges. Grapes. Mango. Pomegranate. Kiwi. Cherries. Meats and Other Protein Sources Seafood and shellfish. Lean meats.Poultry. Tofu. Dairy Low-fat or fat-free dairy products, such as milk, yogurt, and cheese. Beverages Water. Low-fat milk. Milk alternatives, like soy milk or almond milk. Real fruit juice. Condiments Low-sugar or sugar-free ketchup, barbecue sauce, and mayonnaise. Mustard. Relish. Fats and Oils Avocado. Canola or olive oil. Nuts and nut butters.Seeds. The items listed above may not be a complete list of recommended foods or beverages. Contact your dietitian for more options. What foods are not recommended? Red  meat. Palm oil and coconut oil. Processed foods. Fried foods. Alcohol. Sweetened drinks, such as iced tea and soda. Sweets. Salty foods. The items listed above may not be a complete list of foods and beverages to avoid. Contact your dietitian for more information. This information is not intended to replace advice given to you by your health care provider. Make sure you discuss any questions you have with your health care provider. Document Released: 07/01/2014 Document Revised: 06/26/2015 Document Reviewed: 02/26/2014 Elsevier Interactive Patient Education  2018 Elsevier Inc.  

## 2018-01-13 LAB — COMPREHENSIVE METABOLIC PANEL
ALT: 13 IU/L (ref 0–44)
AST: 19 IU/L (ref 0–40)
Albumin/Globulin Ratio: 1.8 (ref 1.2–2.2)
Albumin: 4.7 g/dL (ref 3.5–5.5)
Alkaline Phosphatase: 74 IU/L (ref 39–117)
BILIRUBIN TOTAL: 0.6 mg/dL (ref 0.0–1.2)
BUN/Creatinine Ratio: 20 (ref 9–20)
BUN: 17 mg/dL (ref 6–24)
CHLORIDE: 100 mmol/L (ref 96–106)
CO2: 25 mmol/L (ref 20–29)
Calcium: 9.5 mg/dL (ref 8.7–10.2)
Creatinine, Ser: 0.86 mg/dL (ref 0.76–1.27)
GFR calc Af Amer: 112 mL/min/{1.73_m2} (ref >59)
GFR calc non Af Amer: 97 mL/min/{1.73_m2} (ref >59)
GLUCOSE: 93 mg/dL (ref 65–99)
Globulin, Total: 2.6 g/dL (ref 1.5–4.5)
POTASSIUM: 4 mmol/L (ref 3.5–5.2)
Sodium: 142 mmol/L (ref 134–144)
TOTAL PROTEIN: 7.3 g/dL (ref 6.0–8.5)

## 2018-01-13 LAB — LIPID PANEL
CHOLESTEROL TOTAL: 184 mg/dL (ref 100–199)
Chol/HDL Ratio: 5.4 ratio — ABNORMAL HIGH (ref 0.0–5.0)
HDL: 34 mg/dL — AB (ref >39)
LDL Calculated: 107 mg/dL — ABNORMAL HIGH (ref 0–99)
TRIGLYCERIDES: 217 mg/dL — AB (ref 0–149)
VLDL CHOLESTEROL CAL: 43 mg/dL — AB (ref 5–40)

## 2018-01-13 LAB — CBC WITH DIFFERENTIAL/PLATELET
Basophils Absolute: 0 10*3/uL (ref 0.0–0.2)
Basos: 1 %
EOS (ABSOLUTE): 0.2 10*3/uL (ref 0.0–0.4)
Eos: 4 %
HEMATOCRIT: 48.7 % (ref 37.5–51.0)
Hemoglobin: 16.3 g/dL (ref 13.0–17.7)
IMMATURE GRANULOCYTES: 0 %
Immature Grans (Abs): 0 10*3/uL (ref 0.0–0.1)
LYMPHS ABS: 1.5 10*3/uL (ref 0.7–3.1)
Lymphs: 25 %
MCH: 28.8 pg (ref 26.6–33.0)
MCHC: 33.5 g/dL (ref 31.5–35.7)
MCV: 86 fL (ref 79–97)
Monocytes Absolute: 0.3 10*3/uL (ref 0.1–0.9)
Monocytes: 6 %
NEUTROS PCT: 64 %
Neutrophils Absolute: 3.9 10*3/uL (ref 1.4–7.0)
PLATELETS: 221 10*3/uL (ref 150–450)
RBC: 5.66 x10E6/uL (ref 4.14–5.80)
RDW: 14.2 % (ref 12.3–15.4)
WBC: 6 10*3/uL (ref 3.4–10.8)

## 2018-01-13 LAB — MICROALBUMIN / CREATININE URINE RATIO
Creatinine, Urine: 140.8 mg/dL
Microalb/Creat Ratio: 22.8 mg/g creat (ref 0.0–30.0)
Microalbumin, Urine: 32.1 ug/mL

## 2018-01-13 LAB — TSH: TSH: 2.76 u[IU]/mL (ref 0.450–4.500)

## 2018-01-13 LAB — PSA: PROSTATE SPECIFIC AG, SERUM: 1.4 ng/mL (ref 0.0–4.0)

## 2018-01-14 ENCOUNTER — Encounter: Payer: Self-pay | Admitting: Family Medicine

## 2018-01-15 ENCOUNTER — Encounter: Payer: Self-pay | Admitting: *Deleted

## 2018-01-16 DIAGNOSIS — H11442 Conjunctival cysts, left eye: Secondary | ICD-10-CM | POA: Diagnosis not present

## 2018-01-30 DIAGNOSIS — Z1211 Encounter for screening for malignant neoplasm of colon: Secondary | ICD-10-CM | POA: Diagnosis not present

## 2018-01-30 DIAGNOSIS — K429 Umbilical hernia without obstruction or gangrene: Secondary | ICD-10-CM | POA: Diagnosis not present

## 2018-01-30 DIAGNOSIS — Z86010 Personal history of colonic polyps: Secondary | ICD-10-CM | POA: Diagnosis not present

## 2018-02-01 ENCOUNTER — Ambulatory Visit: Payer: Self-pay

## 2018-02-01 NOTE — Telephone Encounter (Signed)
Returned call to patient who states that he has symptoms of a kidney stone. He states it is the same pain he has had in the past with kidney stones. He reports rt upper back pain that woke him from sleep about midnight. He states the pain is mild today. He is able to work. He is not experiencing any urinary problems He sees no blood in his urine. He has taken Oxycodone Acetaminophen tablet he has from his last kidney stone. Pt was advised that the medication is a narcotic and he should not drive or operate machinery while taking the pain medication. Pt states that he will go to ED if symptom become worse. Appointment was searched but pt declined stating that he will wait to see if symptoms get worse. Care advice read to patient. Pt verbalized understanding of all instructions.  Reason for Disposition . [1] MODERATE back pain (e.g., interferes with normal activities) AND [2] present > 3 days  Answer Assessment - Initial Assessment Questions 1. ONSET: "When did the pain begin?"      Last night at midnight 2. LOCATION: "Where does it hurt?" (upper, mid or lower back)     Rt upper back 3. SEVERITY: "How bad is the pain?"  (e.g., Scale 1-10; mild, moderate, or severe)   - MILD (1-3): doesn't interfere with normal activities    - MODERATE (4-7): interferes with normal activities or awakens from sleep    - SEVERE (8-10): excruciating pain, unable to do any normal activities      Mild today 4. PATTERN: "Is the pain constant?" (e.g., yes, no; constant, intermittent)      Constant ache 5. RADIATION: "Does the pain shoot into your legs or elsewhere?"     no 6. CAUSE:  "What do you think is causing the back pain?"      Kidney stone 7. BACK OVERUSE:  "Any recent lifting of heavy objects, strenuous work or exercise?"     no 8. MEDICATIONS: "What have you taken so far for the pain?" (e.g., nothing, acetaminophen, NSAIDS)     Oxycodone acetaminaphen 9. NEUROLOGIC SYMPTOMS: "Do you have any weakness,  numbness, or problems with bowel/bladder control?"    Urine is clear BM ok 10. OTHER SYMPTOMS: "Do you have any other symptoms?" (e.g., fever, abdominal pain, burning with urination, blood in urine)       No no blood in urine 11. PREGNANCY: "Is there any chance you are pregnant?" (e.g., yes, no; LMP)       N/A  Protocols used: BACK PAIN-A-AH

## 2018-02-04 DIAGNOSIS — R109 Unspecified abdominal pain: Secondary | ICD-10-CM | POA: Diagnosis not present

## 2018-02-04 DIAGNOSIS — N23 Unspecified renal colic: Secondary | ICD-10-CM | POA: Diagnosis not present

## 2018-02-05 ENCOUNTER — Other Ambulatory Visit: Payer: Self-pay

## 2018-02-05 ENCOUNTER — Emergency Department (HOSPITAL_COMMUNITY)
Admission: EM | Admit: 2018-02-05 | Discharge: 2018-02-05 | Disposition: A | Discharge status: Home / Self Care | Payer: 59 | Attending: Emergency Medicine | Admitting: Emergency Medicine

## 2018-02-05 ENCOUNTER — Encounter (HOSPITAL_COMMUNITY): Payer: Self-pay | Admitting: Emergency Medicine

## 2018-02-05 DIAGNOSIS — I10 Essential (primary) hypertension: Secondary | ICD-10-CM | POA: Diagnosis not present

## 2018-02-05 DIAGNOSIS — Z79899 Other long term (current) drug therapy: Secondary | ICD-10-CM | POA: Diagnosis not present

## 2018-02-05 DIAGNOSIS — H11442 Conjunctival cysts, left eye: Secondary | ICD-10-CM | POA: Diagnosis not present

## 2018-02-05 DIAGNOSIS — N23 Unspecified renal colic: Secondary | ICD-10-CM | POA: Insufficient documentation

## 2018-02-05 DIAGNOSIS — R109 Unspecified abdominal pain: Secondary | ICD-10-CM | POA: Diagnosis not present

## 2018-02-05 DIAGNOSIS — R1084 Generalized abdominal pain: Secondary | ICD-10-CM | POA: Diagnosis present

## 2018-02-05 LAB — URINALYSIS, ROUTINE W REFLEX MICROSCOPIC
Bacteria, UA: NONE SEEN
Bilirubin Urine: NEGATIVE
Glucose, UA: NEGATIVE mg/dL
Ketones, ur: NEGATIVE mg/dL
Leukocytes, UA: NEGATIVE
Nitrite: NEGATIVE
PH: 6 (ref 5.0–8.0)
Protein, ur: NEGATIVE mg/dL
Specific Gravity, Urine: 1.014 (ref 1.005–1.030)

## 2018-02-05 LAB — COMPREHENSIVE METABOLIC PANEL
ALK PHOS: 65 U/L (ref 38–126)
ALT: 11 U/L (ref 0–44)
ANION GAP: 11 (ref 5–15)
AST: 32 U/L (ref 15–41)
Albumin: 4.3 g/dL (ref 3.5–5.0)
BUN: 18 mg/dL (ref 6–20)
CO2: 29 mmol/L (ref 22–32)
Calcium: 9.3 mg/dL (ref 8.9–10.3)
Chloride: 100 mmol/L (ref 98–111)
Creatinine, Ser: 1.21 mg/dL (ref 0.61–1.24)
GFR calc Af Amer: >60 mL/min (ref >60)
GFR calc non Af Amer: >60 mL/min (ref >60)
Glucose, Bld: 143 mg/dL — ABNORMAL HIGH (ref 70–99)
Potassium: 3.9 mmol/L (ref 3.5–5.1)
Sodium: 140 mmol/L (ref 135–145)
Total Bilirubin: 1.2 mg/dL (ref 0.3–1.2)
Total Protein: 6.9 g/dL (ref 6.5–8.1)

## 2018-02-05 LAB — CBC
HCT: 46.7 % (ref 39.0–52.0)
Hemoglobin: 15.4 g/dL (ref 13.0–17.0)
MCH: 28.8 pg (ref 26.0–34.0)
MCHC: 33 g/dL (ref 30.0–36.0)
MCV: 87.3 fL (ref 80.0–100.0)
Platelets: 238 10*3/uL (ref 150–400)
RBC: 5.35 MIL/uL (ref 4.22–5.81)
RDW: 12.7 % (ref 11.5–15.5)
WBC: 7.9 10*3/uL (ref 4.0–10.5)
nRBC: 0 % (ref 0.0–0.2)

## 2018-02-05 LAB — LIPASE, BLOOD: Lipase: 37 U/L (ref 11–51)

## 2018-02-05 MED ORDER — IBUPROFEN 600 MG PO TABS: 600.0000 mg | ORAL_TABLET | Freq: Four times a day (QID) | ORAL | 0 refills | Status: DC | PRN

## 2018-02-05 MED ORDER — ONDANSETRON HCL 4 MG/2ML IJ SOLN: 4.0000 mg | Freq: Once | INTRAMUSCULAR | Status: DC | PRN

## 2018-02-05 MED ORDER — TAMSULOSIN HCL 0.4 MG PO CAPS
0.4000 mg | ORAL_CAPSULE | Freq: Once | ORAL | Status: AC
  Administered 2018-02-05: 0.4 mg via ORAL
  Filled 2018-02-05: qty 1

## 2018-02-05 MED ORDER — ONDANSETRON 4 MG PO TBDP: 4.0000 mg | ORAL_TABLET | Freq: Three times a day (TID) | ORAL | 0 refills | Status: AC | PRN

## 2018-02-05 MED ORDER — HYDROCODONE-ACETAMINOPHEN 5-325 MG PO TABS: 1.0000 | ORAL_TABLET | Freq: Three times a day (TID) | ORAL | 0 refills | Status: AC | PRN

## 2018-02-05 MED ORDER — KETOROLAC TROMETHAMINE 15 MG/ML IJ SOLN
15.0000 mg | Freq: Once | INTRAMUSCULAR | Status: AC
  Administered 2018-02-05: 15 mg via INTRAVENOUS
  Filled 2018-02-05: qty 1

## 2018-02-05 MED ORDER — TAMSULOSIN HCL 0.4 MG PO CAPS: 0.4000 mg | ORAL_CAPSULE | Freq: Every day | ORAL | 0 refills | Status: AC

## 2018-02-05 NOTE — ED Notes (Signed)
Pt reports right sided flank pain for the past 2 days. Pt reports a history of kidney stones and believes he has developed some more.

## 2018-02-05 NOTE — ED Provider Notes (Signed)
[facility] EMERGENCY DEPARTMENT Provider Note  CSN: [id number] Arrival date & time: 02/05/18 0004  Chief Complaint(s) Possible Kidney Stones  HPI Nathaniel Mosley is a 56 y.o. male with a history of renal stones who presents to the emergency department with several days of right flank pain.  He reports that the pain began 5 days ago.  After the first day he reports that the pain completely resolved however it returned the following day.  Since then the pain has been constant and fluctuating in nature.  He endorses pain radiation to the growing.  Denies any associated nausea or vomiting.  No fevers or chills.  No dysuria.  No diarrhea.  Patient took Tylenol which provided mild relief.  No other alleviating or aggravating factors.  HPI  Past Medical History Past Medical History:  Diagnosis Date  . Allergy   . Hypertension   . Kidney stone on left side    Patient Active Problem List   Diagnosis Date Noted  . Obesity 06/06/2016  . Left lateral epicondylitis 12/09/2015  . Seasonal allergies 07/07/2011  . Kidney stone 07/07/2011  . HTN (hypertension) 04/20/2011   Home Medication(s) Prior to Admission medications   Medication Sig Start Date End Date Taking? Authorizing Provider  amLODipine (NORVASC) 5 MG tablet Take 1 tablet (5 mg total) by mouth daily. 01/12/18   Shawnee Knapp, MD  ketoconazole (NIZORAL) 2 % cream APPLY EVERY DAY 08/04/17   Shawnee Knapp, MD                                                                                                                                    Past Surgical History Past Surgical History:  Procedure Laterality Date  . FRACTURE SURGERY    . OPEN REPAIR PERIARTICULAR FRACTURE / DISLOCATION ELBOW  1998  . PLANTAR FASCIA RELEASE  2013   Family History Family History  Problem Relation Age of Onset  . Cancer Mother   . Diabetes Father     Social History Social History   Tobacco Use  . Smoking status: Never Smoker   . Smokeless tobacco: Never Used  Substance Use Topics  . Alcohol use: No  . Drug use: No   Allergies Azithromycin and Erythromycin  Review of Systems Review of Systems All other systems are reviewed and are negative for acute change except as noted in the HPI  Physical Exam Vital Signs  I have reviewed the triage vital signs BP (!) 177/109 (BP Location: Right Arm)   Pulse 76   Temp 98.9 F (37.2 C) (Oral)   Resp 18   Ht 5' 5.75" (1.67 m)   Wt 97.5 kg   SpO2 98%   BMI 34.97 kg/m   Physical Exam  Constitutional: He is oriented to person, place, and time. He appears well-developed and well-nourished. No distress.  HENT:  Head: Normocephalic and atraumatic.  Right Ear: External ear normal.  Left Ear: External ear normal.  Nose: Nose normal.  Mouth/Throat: Mucous membranes are normal. No trismus in the jaw.  Eyes: Conjunctivae and EOM are normal. No scleral icterus.  Neck: Normal range of motion and phonation normal.  Cardiovascular: Normal rate and regular rhythm.  Pulmonary/Chest: Effort normal. No stridor. No respiratory distress.  Abdominal: He exhibits no distension. There is no tenderness. There is CVA tenderness (right). There is no rigidity, no rebound, no guarding and no tenderness at McBurney's point. No hernia.  Musculoskeletal: Normal range of motion. He exhibits no edema.  Neurological: He is alert and oriented to person, place, and time.  Skin: He is not diaphoretic.  Psychiatric: He has a normal mood and affect. His behavior is normal.  Vitals reviewed.   ED Results and Treatments Labs (all labs ordered are listed, but only abnormal results are displayed) Labs Reviewed  COMPREHENSIVE METABOLIC PANEL - Abnormal; Notable for the following components:      Result Value   Glucose, Bld 143 (*)    All other components within normal limits  URINALYSIS, ROUTINE W REFLEX MICROSCOPIC - Abnormal; Notable for the following components:   Color, Urine STRAW (*)      Hgb urine dipstick MODERATE (*)    All other components within normal limits  LIPASE, BLOOD  CBC                                                                                                                         EKG  EKG Interpretation  Date/Time:    Ventricular Rate:    PR Interval:    QRS Duration:   QT Interval:    QTC Calculation:   R Axis:     Text Interpretation:        Radiology No results found. Pertinent labs & imaging results that were available during my care of the patient were reviewed by me and considered in my medical decision making (see chart for details).  Medications Ordered in ED Medications  ondansetron (ZOFRAN) injection 4 mg (has no administration in time range)  ketorolac (TORADOL) 15 MG/ML injection 15 mg (15 mg Intravenous Given 02/05/18 0134)                                                                                                                                    Procedures Procedures EMERGENCY DEPARTMENT US RENAL EXAM  "Study: Limited Retroperitoneal  Ultrasound of Kidneys"  INDICATIONS: Flank pain Long and short axis of both kidneys were obtained.   PERFORMED BY: Myself IMAGES ARCHIVED?: Yes LIMITATIONS: Body habitus VIEWS USED: Long axis and Short axis  INTERPRETATION: Right Hydronephrosis mild, Left Kidney stone  (including critical care time)  Medical Decision Making / ED Course I have reviewed the nursing notes for this encounter and the patient's prior records (if available in EHR or on provided paperwork).    Right flank pain consistent with renal colic.  UA positive for hematuria.  No evidence of infection.  No renal insufficiency on labs.  Bedside ultrasound with evidence of mild hydronephrosis on the right.  Provided with IV Toradol which resulted in complete resolution of his pain.  Given first dose of Flomax in the emergency department.  Expectant management.  The patient appears reasonably screened and/or  stabilized for discharge and I doubt any other medical condition or other [facility] requiring further screening, evaluation, or treatment in the ED at this time prior to discharge.  The patient is safe for discharge with strict return precautions.   Final Clinical Impression(s) / ED Diagnoses Final diagnoses:  None   Disposition: Discharge  Condition: Good  I have discussed the results, Dx and Tx plan with the patient who expressed understanding and agree(s) with the plan. Discharge instructions discussed at great length. The patient was given strict return precautions who verbalized understanding of the instructions. No further questions at time of discharge.    ED Discharge Orders         Ordered    tamsulosin (FLOMAX) 0.4 MG CAPS capsule  Daily     02/05/18 0209    ibuprofen (ADVIL,MOTRIN) 600 MG tablet  Every 6 hours PRN     02/05/18 0209    HYDROcodone-acetaminophen (NORCO/VICODIN) 5-325 MG tablet  Every 8 hours PRN     02/05/18 0209    ondansetron (ZOFRAN ODT) 4 MG disintegrating tablet  Every 8 hours PRN     02/05/18 0209           Follow Up: Shawnee Knapp, MD [place] [address] [phone]  Schedule an appointment as soon as possible for a visit  As needed  [address], [place] [address] [phone]  Schedule an appointment as soon as possible for a visit        This chart was dictated using voice recognition software.  Despite best efforts to proofread,  errors can occur which can change the documentation meaning.   Fatima Blank, MD 02/05/18 [phone]

## 2018-02-07 ENCOUNTER — Telehealth: Payer: Self-pay | Admitting: Family Medicine

## 2018-02-07 NOTE — Telephone Encounter (Signed)
Copied from [place] [phone]. Topic: Quick Communication - See Telephone Encounter >> Feb 27, 2018 11:41 AM Burchel, Abbi R wrote: CRM for notification. See Telephone encounter for: 2018/02/27.  Pt states he has passed a kidney stone and would like to have Dr Brigitte Pulse or the lab take a look at it.  Please advise.   Pt: [phone]

## 2018-02-09 NOTE — Telephone Encounter (Signed)
Patient would like his kidney stone examined.

## 2018-02-14 DIAGNOSIS — D122 Benign neoplasm of ascending colon: Secondary | ICD-10-CM | POA: Diagnosis not present

## 2018-02-14 DIAGNOSIS — D120 Benign neoplasm of cecum: Secondary | ICD-10-CM | POA: Diagnosis not present

## 2018-02-14 DIAGNOSIS — K635 Polyp of colon: Secondary | ICD-10-CM | POA: Diagnosis not present

## 2018-02-14 DIAGNOSIS — Z1211 Encounter for screening for malignant neoplasm of colon: Secondary | ICD-10-CM | POA: Diagnosis not present

## 2018-02-14 LAB — HM COLONOSCOPY

## 2018-02-23 ENCOUNTER — Other Ambulatory Visit: Payer: Self-pay | Admitting: Family Medicine

## 2018-02-23 NOTE — Telephone Encounter (Signed)
Requested medication (s) are due for refill today: yes  Requested medication (s) are on the active medication list: yes  Last refill:  08/07/17  Future visit scheduled: no  Notes to clinic:  Physical completed 01/12/18.  Medication off protocol.    Requested Prescriptions  Pending Prescriptions Disp Refills   ketoconazole (NIZORAL) 2 % cream [Pharmacy Med Name: KETOCONAZOLE 2% CREAM] 30 g 1    Sig: APPLY EVERY DAY     Off-Protocol Failed - 02/23/2018  9:14 AM      Failed - Medication not assigned to a protocol, review manually.      Passed - Valid encounter within last 12 months    Recent Outpatient Visits          1 month ago Annual physical exam   [facility], Laurey Arrow, MD   1 year ago Essential hypertension   [facility], Laurey Arrow, MD   1 year ago Annual physical exam   [facility], Laurey Arrow, MD   2 years ago Essential hypertension   [facility], Laurey Arrow, MD   2 years ago Annual physical exam   [facility] [place], [place]

## 2018-04-18 ENCOUNTER — Ambulatory Visit (INDEPENDENT_AMBULATORY_CARE_PROVIDER_SITE_OTHER): Payer: 59

## 2018-04-18 ENCOUNTER — Encounter: Payer: Self-pay | Admitting: Podiatry

## 2018-04-18 ENCOUNTER — Ambulatory Visit: Payer: 59 | Admitting: Podiatry

## 2018-04-18 ENCOUNTER — Other Ambulatory Visit: Payer: Self-pay | Admitting: Podiatry

## 2018-04-18 VITALS — BP 141/80

## 2018-04-18 DIAGNOSIS — M7672 Peroneal tendinitis, left leg: Secondary | ICD-10-CM | POA: Diagnosis not present

## 2018-04-18 DIAGNOSIS — M79672 Pain in left foot: Secondary | ICD-10-CM | POA: Diagnosis not present

## 2018-04-18 MED ORDER — TRIAMCINOLONE ACETONIDE 10 MG/ML IJ SUSP
10.0000 mg | Freq: Once | INTRAMUSCULAR | Status: AC
  Administered 2018-04-18: 10 mg

## 2018-04-18 MED ORDER — DICLOFENAC SODIUM 75 MG PO TBEC: 75.0000 mg | DELAYED_RELEASE_TABLET | Freq: Two times a day (BID) | ORAL | 2 refills | Status: DC

## 2018-04-23 NOTE — Progress Notes (Signed)
Subjective:   Patient ID: Nathaniel Mosley, male   DOB: 58 y.o.   MRN: [id number]   HPI Patient presents with a lot of pain in the outside of the left foot and states is been very active on this foot and this occurred over the last several months.  Patient is not been seen for a long time and has had periodic problems with foot structure and does not smoke and likes to be active   Review of Systems  All other systems reviewed and are negative.       Objective:  Physical Exam Vitals signs and nursing note reviewed.  Constitutional:      Appearance: He is well-developed.  Pulmonary:     Effort: Pulmonary effort is normal.  Musculoskeletal: Normal range of motion.  Skin:    General: Skin is warm.  Neurological:     Mental Status: He is alert.     Neurovascular status intact muscle strength is adequate range of motion within normal limits with patient found to have exquisite discomfort lateral side left foot at the insertion of the peroneal tendon base of fifth metatarsal.  It is inflamed and sore but I did not note tendon dysfunction or any indications of tear of the tendon.  Patient was noted to have good digital perfusion well oriented x3     Assessment:  Acute peroneal tendinitis left at the insertion base of fifth metatarsal     Plan:  H&P x-rays reviewed condition discussed.  Today sterile prep administered to the area and using Kenalog 5 mg and Xylocaine I injected the key that she at its insertion 3 mg and advised this patient on elevation ice therapy and supportive shoes.  Reappoint if symptoms persist and educated him on this  X-ray indicates that there is no signs of fracture or bone pathology currently

## 2018-04-24 NOTE — Progress Notes (Signed)
tubular adenomas repeat in 5 years

## 2018-06-01 ENCOUNTER — Telehealth (INDEPENDENT_AMBULATORY_CARE_PROVIDER_SITE_OTHER): Payer: 59 | Admitting: Family Medicine

## 2018-06-01 ENCOUNTER — Encounter: Payer: Self-pay | Admitting: Family Medicine

## 2018-06-01 ENCOUNTER — Other Ambulatory Visit: Payer: Self-pay

## 2018-06-01 DIAGNOSIS — L989 Disorder of the skin and subcutaneous tissue, unspecified: Secondary | ICD-10-CM

## 2018-06-01 NOTE — Progress Notes (Signed)
Pt c/o bump on back since week and a half. Pt says it looks like a large angry pimple/wart like. Says it has a brown top with redness. No travel.

## 2018-06-01 NOTE — Progress Notes (Signed)
Virtual Visit via Video Note  I connected with Nathaniel Mosley on 06/01/18 at 5:19 PM by a video enabled telemedicine application and verified that I am speaking with the correct person using two identifiers.   I discussed the limitations, risks, security and privacy concerns of performing an evaluation and management service by telephone and the availability of in person appointments. I also discussed with the patient that there may be a patient responsible charge related to this service. The patient expressed understanding and agreed to proceed, consent obtained  Chief complaint:  Bump/cyst on back.   History of Present Illness: Nathaniel Mosley is a 58 y.o. male  Itch on back of neck about 1.5-2 weeks ago. Feels like large wart. Still itching. No bleeding or discharge. No changes past few days.  No attempted treatments/no creams.   No other rash.  No contact with metal to area.   Past Medical History:  Diagnosis Date  . Allergy   . Hypertension   . Kidney stone on left side    Past Surgical History:  Procedure Laterality Date  . FRACTURE SURGERY    . OPEN REPAIR PERIARTICULAR FRACTURE / DISLOCATION ELBOW  1998  . PLANTAR FASCIA RELEASE  2013   Allergies  Allergen Reactions  . Azithromycin Other (See Comments)    GI upset   Prior to Admission medications   Medication Sig Start Date End Date Taking? Authorizing Provider  amLODipine (NORVASC) 5 MG tablet Take 1 tablet (5 mg total) by mouth daily. 01/12/18  Yes Shawnee Knapp, MD  ibuprofen (ADVIL,MOTRIN) 600 MG tablet Take 1 tablet (600 mg total) by mouth every 6 (six) hours as needed. 02/05/18  Yes Cardama, Grayce Sessions, MD  diclofenac (VOLTAREN) 75 MG EC tablet Take 1 tablet (75 mg total) by mouth 2 (two) times daily. Patient not taking: Reported on 06/01/2018 04/18/18   Wallene Huh, DPM  ketoconazole (NIZORAL) 2 % cream APPLY EVERY DAY Patient not taking: Reported on 06/01/2018 02/23/18   Shawnee Knapp, MD    Social History   Socioeconomic History  . Marital status: Single    Spouse name: Not on file  . Number of children: 0  . Years of education: Not on file  . Highest education level: Not on file  Occupational History  . Occupation: Engineer, civil (consulting): [employer]  . Financial resource strain: Not on file  . Food insecurity:    Worry: Not on file    Inability: Not on file  . Transportation needs:    Medical: Not on file    Non-medical: Not on file  Tobacco Use  . Smoking status: Never Smoker  . Smokeless tobacco: Never Used  Substance and Sexual Activity  . Alcohol use: No  . Drug use: No  . Sexual activity: Not Currently    Birth control/protection: Abstinence  Lifestyle  . Physical activity:    Days per week: Not on file    Minutes per session: Not on file  . Stress: Not on file  Relationships  . Social connections:    Talks on phone: Not on file    Gets together: Not on file    Attends religious service: Not on file    Active member of club or organization: Not on file    Attends meetings of clubs or organizations: Not on file    Relationship status: Not on file  . Intimate partner violence:    Fear of current  or ex partner: Not on file    Emotionally abused: Not on file    Physically abused: Not on file    Forced sexual activity: Not on file  Other Topics Concern  . Not on file  Social History Narrative  . Not on file    Observations/Objective: Based on exam from patient's video on phone, appears to have approximately 6 to 7 mm elevated flesh colored lesion on left upper back, rounded appearance, slight irritation on surface, but no apparent bleeding or discharge.  No visible surrounding erythema on video but patient plans to send photos that he took previously.  No vesicles, no surrounding lesions      Assessment and Plan: Back skin lesion  -Based on video appearance, possible pyogenic granuloma vs irritated nevus. No  apprent signs of cellulits at present. Will review phots when sent.   - neosporin to area ok for now with bandage covering. Ok to cleanse with soap/water as usual, but look at lesion daily to watch for signs of infection.   RTC/urgent care precautions if worsens as will need in office eval and excision possibly.   - if stable, can discuss follow up in 1 week by telemed initially to determine in office vs derm eval.   6:07 PM Photos now visible. Pyogenic vs. Basal cell.  Will likely need der eval, but can check into further next week.   Follow Up Instructions:   I discussed the assessment and treatment plan with the patient. The patient was provided an opportunity to ask questions and all were answered. The patient agreed with the plan and demonstrated an understanding of the instructions.   The patient was advised to call back or seek an in-person evaluation if the symptoms worsen or if the condition fails to improve as anticipated.  I provided 14 minutes of non-face-to-face time during this encounter.   Wendie Agreste, MD

## 2018-06-04 ENCOUNTER — Other Ambulatory Visit: Payer: Self-pay | Admitting: Family Medicine

## 2018-06-04 NOTE — Progress Notes (Signed)
telemed follow up in next 1 week. Lesion on back.

## 2018-06-05 NOTE — Telephone Encounter (Signed)
Former shaw pt

## 2018-07-25 ENCOUNTER — Other Ambulatory Visit: Payer: Self-pay

## 2018-07-25 ENCOUNTER — Encounter (HOSPITAL_COMMUNITY): Payer: Self-pay | Admitting: Emergency Medicine

## 2018-07-25 ENCOUNTER — Emergency Department (HOSPITAL_COMMUNITY)
Admission: EM | Admit: 2018-07-25 | Discharge: 2018-07-26 | Disposition: A | Discharge status: Home / Self Care | Payer: 59 | Attending: Emergency Medicine | Admitting: Emergency Medicine

## 2018-07-25 DIAGNOSIS — I10 Essential (primary) hypertension: Secondary | ICD-10-CM | POA: Diagnosis not present

## 2018-07-25 DIAGNOSIS — E669 Obesity, unspecified: Secondary | ICD-10-CM | POA: Diagnosis not present

## 2018-07-25 DIAGNOSIS — Z87442 Personal history of urinary calculi: Secondary | ICD-10-CM | POA: Diagnosis not present

## 2018-07-25 DIAGNOSIS — R319 Hematuria, unspecified: Secondary | ICD-10-CM | POA: Insufficient documentation

## 2018-07-25 DIAGNOSIS — R1032 Left lower quadrant pain: Secondary | ICD-10-CM | POA: Diagnosis present

## 2018-07-25 DIAGNOSIS — Z6835 Body mass index (BMI) 35.0-35.9, adult: Secondary | ICD-10-CM | POA: Insufficient documentation

## 2018-07-25 DIAGNOSIS — N23 Unspecified renal colic: Secondary | ICD-10-CM | POA: Insufficient documentation

## 2018-07-25 LAB — URINALYSIS, ROUTINE W REFLEX MICROSCOPIC
Bacteria, UA: NONE SEEN
Bilirubin Urine: NEGATIVE
Glucose, UA: NEGATIVE mg/dL
Ketones, ur: NEGATIVE mg/dL
Leukocytes,Ua: NEGATIVE
Nitrite: NEGATIVE
Protein, ur: 30 mg/dL — AB
RBC / HPF: >50 RBC/hpf — ABNORMAL HIGH (ref 0–5)
Specific Gravity, Urine: 1.021 (ref 1.005–1.030)
pH: 5 (ref 5.0–8.0)

## 2018-07-25 MED ORDER — KETOROLAC TROMETHAMINE 60 MG/2ML IM SOLN
30.0000 mg | Freq: Once | INTRAMUSCULAR | Status: AC
  Administered 2018-07-25: 30 mg via INTRAMUSCULAR
  Filled 2018-07-25: qty 2

## 2018-07-25 NOTE — ED Provider Notes (Signed)
[facility] EMERGENCY DEPARTMENT Provider Note  CSN: [id number] Arrival date & time: 07/25/18 2204  Chief Complaint(s) Flank Pain and Abdominal Pain  HPI Nathaniel Mosley is a 58 y.o. male with a history of renal stones who presents to the emergency department with left-sided flank pain similar to prior renal stone pain.  This began yesterday morning and has been constant and fluctuating since onset.  Pain has improved as of 30 minutes ago.  Patient endorsed hematuria with voiding.  Pain is now down to a 2 out of 10.  Denies any current nausea or vomiting.  No fevers or chills.  No diarrhea.  Denies any other physical complaints.  HPI  Past Medical History Past Medical History:  Diagnosis Date  . Allergy   . Hypertension   . Kidney stone on left side    Patient Active Problem List   Diagnosis Date Noted  . Obesity 06/06/2016  . Left lateral epicondylitis 12/09/2015  . Seasonal allergies 07/07/2011  . Kidney stone 07/07/2011  . HTN (hypertension) 04/20/2011   Home Medication(s) Prior to Admission medications   Medication Sig Start Date End Date Taking? Authorizing Provider  amLODipine (NORVASC) 5 MG tablet Take 1 tablet (5 mg total) by mouth daily. 01/12/18   Shawnee Knapp, MD  diclofenac (VOLTAREN) 75 MG EC tablet Take 1 tablet (75 mg total) by mouth 2 (two) times daily. Patient not taking: Reported on 06/01/2018 04/18/18   Wallene Huh, DPM  ibuprofen (ADVIL,MOTRIN) 600 MG tablet Take 1 tablet (600 mg total) by mouth every 6 (six) hours as needed. 02/05/18   Fatima Blank, MD  ketoconazole (NIZORAL) 2 % cream APPLY TO AFFECTED AREA EVERY DAY 06/07/18   Forrest Moron, MD                                                                                                                                    Past Surgical History Past Surgical History:  Procedure Laterality Date  . FRACTURE SURGERY    . OPEN REPAIR PERIARTICULAR FRACTURE / DISLOCATION  ELBOW  1998  . PLANTAR FASCIA RELEASE  2013   Family History Family History  Problem Relation Age of Onset  . Cancer Mother   . Diabetes Father     Social History Social History   Tobacco Use  . Smoking status: Never Smoker  . Smokeless tobacco: Never Used  Substance Use Topics  . Alcohol use: No  . Drug use: No   Allergies Azithromycin  Review of Systems Review of Systems All other systems are reviewed and are negative for acute change except as noted in the HPI  Physical Exam Vital Signs  I have reviewed the triage vital signs BP (!) 128/92   Pulse 90   Temp 98.7 F (37.1 C) (Oral)   Resp 17   Ht 5\' 6"  (1.676 m)   Wt 99.8 kg   SpO2  90%   BMI 35.51 kg/m   Physical Exam Vitals signs reviewed.  Constitutional:      General: He is not in acute distress.    Appearance: He is well-developed. He is not diaphoretic.  HENT:     Head: Normocephalic and atraumatic.     Jaw: No trismus.     Right Ear: External ear normal.     Left Ear: External ear normal.     Nose: Nose normal.  Eyes:     General: No scleral icterus.    Conjunctiva/sclera: Conjunctivae normal.  Neck:     Musculoskeletal: Normal range of motion.     Trachea: Phonation normal.  Cardiovascular:     Rate and Rhythm: Normal rate and regular rhythm.  Pulmonary:     Effort: Pulmonary effort is normal. No respiratory distress.     Breath sounds: No stridor.  Abdominal:     General: There is no distension.     Tenderness: There is no abdominal tenderness.  Musculoskeletal: Normal range of motion.  Neurological:     Mental Status: He is alert and oriented to person, place, and time.  Psychiatric:        Behavior: Behavior normal.     ED Results and Treatments Labs (all labs ordered are listed, but only abnormal results are displayed) Labs Reviewed  URINALYSIS, ROUTINE W REFLEX MICROSCOPIC - Abnormal; Notable for the following components:      Result Value   Hgb urine dipstick MODERATE (*)     Protein, ur 30 (*)    RBC / HPF >50 (*)    All other components within normal limits                                                                                                                         EKG  EKG Interpretation  Date/Time:    Ventricular Rate:    PR Interval:    QRS Duration:   QT Interval:    QTC Calculation:   R Axis:     Text Interpretation:        Radiology No results found. Pertinent labs & imaging results that were available during my care of the patient were reviewed by me and considered in my medical decision making (see chart for details).  Medications Ordered in ED Medications  ketorolac (TORADOL) injection 30 mg (30 mg Intramuscular Given 07/25/18 2350)  Procedures Procedures  (including critical care time)  Medical Decision Making / ED Course I have reviewed the nursing notes for this encounter and the patient's prior records (if available in EHR or on provided paperwork).    Presentation consistent with renal colic.  UA with hematuria but no evidence of infection.  Patient provided with IM Toradol for additional pain relief.  Instructed to continue Flomax and follow-up with urology.  The patient appears reasonably screened and/or stabilized for discharge and I doubt any other medical condition or other [facility] requiring further screening, evaluation, or treatment in the ED at this time prior to discharge.  The patient is safe for discharge with strict return precautions.   Final Clinical Impression(s) / ED Diagnoses Final diagnoses:  Renal colic on left side   Disposition: Discharge  Condition: Good  I have discussed the results, Dx and Tx plan with the patient who expressed understanding and agree(s) with the plan. Discharge instructions discussed at great length. The patient was given strict return  precautions who verbalized understanding of the instructions. No further questions at time of discharge.    ED Discharge Orders    None       Follow Up: [facility] [place] [phone]        This chart was dictated using voice recognition software.  Despite best efforts to proofread,  errors can occur which can change the documentation meaning.   Fatima Blank, MD 07/25/18 2356

## 2018-07-25 NOTE — ED Triage Notes (Signed)
C/o L flank pain and L sided abd pain since yesterday with hematuria.  Denies dysuria.  History of kidney stones and states this feels the same.

## 2018-08-10 ENCOUNTER — Other Ambulatory Visit (HOSPITAL_COMMUNITY)
Admission: RE | Admit: 2018-08-10 | Discharge: 2018-08-10 | Disposition: A | Discharge status: Home / Self Care | Payer: 59 | Source: Ambulatory Visit | Attending: Urology | Admitting: Urology

## 2018-08-10 ENCOUNTER — Other Ambulatory Visit: Payer: Self-pay | Admitting: Urology

## 2018-08-10 ENCOUNTER — Encounter (HOSPITAL_COMMUNITY): Payer: Self-pay | Admitting: General Practice

## 2018-08-10 DIAGNOSIS — Z1159 Encounter for screening for other viral diseases: Secondary | ICD-10-CM | POA: Diagnosis not present

## 2018-08-10 DIAGNOSIS — Z01812 Encounter for preprocedural laboratory examination: Secondary | ICD-10-CM | POA: Diagnosis present

## 2018-08-10 NOTE — Progress Notes (Signed)
SPOKE W/  paitnet via phone     SCREENING SYMPTOMS OF COVID 19:   COUGH--no  RUNNY NOSE--- no  SORE THROAT---no  NASAL CONGESTION----no  SNEEZING----no  SHORTNESS OF BREATH---no  DIFFICULTY BREATHING---no  TEMP >100.0 -----no  UNEXPLAINED BODY ACHES------no  CHILLS -------- no  HEADACHES ---------no  LOSS OF SMELL/ TASTE --------no    HAVE YOU OR ANY FAMILY MEMBER TRAVELLED PAST 14 DAYS OUT OF THE   COUNTY---no STATE----no COUNTRY----no  HAVE YOU OR ANY FAMILY MEMBER BEEN EXPOSED TO ANYONE WITH COVID 19? no

## 2018-08-11 LAB — NOVEL CORONAVIRUS, NAA (HOSP ORDER, SEND-OUT TO REF LAB; TAT 18-24 HRS): SARS-CoV-2, NAA: NOT DETECTED

## 2018-08-13 ENCOUNTER — Encounter (HOSPITAL_COMMUNITY)
Admission: RE | Disposition: A | Discharge status: Home / Self Care | Payer: Self-pay | Source: Home / Self Care | Attending: Urology

## 2018-08-13 ENCOUNTER — Encounter (HOSPITAL_COMMUNITY): Payer: Self-pay | Admitting: Anesthesiology

## 2018-08-13 ENCOUNTER — Ambulatory Visit (HOSPITAL_COMMUNITY)
Admission: RE | Admit: 2018-08-13 | Discharge: 2018-08-13 | Disposition: A | Discharge status: Home / Self Care | Payer: 59 | Attending: Urology | Admitting: Urology

## 2018-08-13 ENCOUNTER — Encounter (HOSPITAL_COMMUNITY): Payer: Self-pay | Admitting: General Practice

## 2018-08-13 ENCOUNTER — Ambulatory Visit (HOSPITAL_COMMUNITY): Payer: 59

## 2018-08-13 DIAGNOSIS — N201 Calculus of ureter: Secondary | ICD-10-CM

## 2018-08-13 DIAGNOSIS — N132 Hydronephrosis with renal and ureteral calculous obstruction: Secondary | ICD-10-CM | POA: Insufficient documentation

## 2018-08-13 DIAGNOSIS — Z881 Allergy status to other antibiotic agents status: Secondary | ICD-10-CM | POA: Diagnosis not present

## 2018-08-13 DIAGNOSIS — Z79899 Other long term (current) drug therapy: Secondary | ICD-10-CM | POA: Diagnosis not present

## 2018-08-13 DIAGNOSIS — R3129 Other microscopic hematuria: Secondary | ICD-10-CM | POA: Insufficient documentation

## 2018-08-13 DIAGNOSIS — I10 Essential (primary) hypertension: Secondary | ICD-10-CM | POA: Diagnosis not present

## 2018-08-13 DIAGNOSIS — Z7982 Long term (current) use of aspirin: Secondary | ICD-10-CM | POA: Diagnosis not present

## 2018-08-13 HISTORY — DX: Personal history of urinary calculi: Z87.442

## 2018-08-13 HISTORY — PX: EXTRACORPOREAL SHOCK WAVE LITHOTRIPSY: SHX1557

## 2018-08-13 SURGERY — LITHOTRIPSY, ESWL
Anesthesia: LOCAL | Laterality: Left

## 2018-08-13 MED ORDER — SODIUM CHLORIDE 0.9 % IV SOLN
INTRAVENOUS | Status: DC
  Administered 2018-08-13: 08:00:00 via INTRAVENOUS

## 2018-08-13 MED ORDER — CIPROFLOXACIN HCL 500 MG PO TABS
500.0000 mg | ORAL_TABLET | ORAL | Status: AC
  Administered 2018-08-13: 500 mg via ORAL
  Filled 2018-08-13: qty 1

## 2018-08-13 MED ORDER — DIPHENHYDRAMINE HCL 25 MG PO CAPS
25.0000 mg | ORAL_CAPSULE | ORAL | Status: AC
  Administered 2018-08-13: 25 mg via ORAL
  Filled 2018-08-13: qty 1

## 2018-08-13 MED ORDER — DIAZEPAM 5 MG PO TABS
10.0000 mg | ORAL_TABLET | ORAL | Status: AC
  Administered 2018-08-13: 10 mg via ORAL
  Filled 2018-08-13: qty 2

## 2018-08-13 NOTE — H&P (Signed)
Cc: Renal colic.  HPI:  08/02/2018  58 year old male with history of nephrolithiasis. He has had 4 stones in the past. These started 2 years ago. He has passed one about every 6 months. Last imaging study was 09/22/2016. This revealed a distal left ureteral calculus that was 4 mm. He also had bilateral nonobstructing calculi. The largest was an 8 mm lower pole calculus. For about the past week, he has had left-sided flank pain. It has migrated down towards his abdomen and towards his groin. He feels like he was about to pass a stone. He has not seen a stone pass. Despite straining his urine. He has persistent microscopic hematuria but his pain, nausea, vomiting have resolved. He denies any fever, chill.   08/10/18:  This patient reports initially having nausea, vomiting, and left flank pain, but he has been asymptomatic for approximately 1 week. He also reports initially having gross hematuria, but he denies difficulty urinating or dysuria. He has been taking Flomax and vigorously hydrating, and he has seen no stone pass.     CC: I have ureteral stone.  HPI: Nathaniel Mosley is a 57 year-old male established patient who is here for ureteral stone.  The problem is on the left side.     ALLERGIES: Azithromycin    MEDICATIONS: Aspirin  Amlodipine Besylate 5 mg tablet  Flomax  Flonase Allergy Relief     GU PSH: No GU PSH    NON-GU PSH: Neck Surgery, Tumor removed    GU PMH: Renal and ureteral calculus - 11/04/7891 Renal colic - 09/28/173 Renal calculus    NON-GU PMH: Hypertension    FAMILY HISTORY: No Family History    SOCIAL HISTORY: Marital Status: Single Preferred Language: English; Race: White Current Smoking Status: Patient has never smoked.   Tobacco Use Assessment Completed: Used Tobacco in last 30 days? Drinks 1 caffeinated drink per day.    REVIEW OF SYSTEMS:    GU Review Male:   Patient denies frequent urination, hard to postpone urination, burning/ pain with  urination, get up at night to urinate, leakage of urine, stream starts and stops, trouble starting your stream, have to strain to urinate , erection problems, and penile pain.  Gastrointestinal (Upper):   Patient denies nausea, vomiting, and indigestion/ heartburn.  Gastrointestinal (Lower):   Patient denies diarrhea and constipation.  Constitutional:   Patient denies fever, night sweats, weight loss, and fatigue.  Skin:   Patient denies skin rash/ lesion and itching.  Eyes:   Patient denies blurred vision and double vision.  Ears/ Nose/ Throat:   Patient denies sore throat and sinus problems.  Hematologic/Lymphatic:   Patient denies swollen glands and easy bruising.  Cardiovascular:   Patient denies leg swelling and chest pains.  Respiratory:   Patient denies cough and shortness of breath.  Endocrine:   Patient denies excessive thirst.  Musculoskeletal:   Patient denies back pain and joint pain.  Neurological:   Patient denies headaches and dizziness.  Psychologic:   Patient denies depression and anxiety.   VITAL SIGNS:      08/10/2018 08:44 AM  BP 149/87 mmHg  Pulse 69 /min  Temperature 97.8 F / 36.5 C   MULTI-SYSTEM PHYSICAL EXAMINATION:    Constitutional: Well-nourished. No physical deformities. Normally developed. Good grooming.  Neck: Neck symmetrical, not swollen. Normal tracheal position.  Respiratory: No labored breathing, no use of accessory muscles.   Neurologic / Psychiatric: Oriented to time, oriented to place, oriented to person. No depression, no anxiety, no  agitation.  Gastrointestinal: No mass, no tenderness, no rigidity, non obese abdomen. No CVAT.  Musculoskeletal: Normal gait and station of head and neck.     PAST DATA REVIEWED:  Source Of History:  Patient   PROCEDURES:         KUB - [id number]  A single view of the abdomen is obtained. Approximate 5 mm stone noted at the mid left ureter.      Patient confirmed No Neulasta OnPro Device.            Renal  Ultrasound - [id number]  Right Kidney: Length: 13.2 cm Depth: 5.5 cm Cortical Width: 1.3 cm Width: 5.2 cm  Left Kidney: Length: 13.0 cm Depth:6.2 cm Cortical Width:2.0 cm Width:6.3 cm  Left Kidney/Ureter:  Hydro noted, Cystic area LP= 2.1x2.3x2.1cm, ? Echogenic focus mid ureter= 0.57cm  Right Kidney/Ureter:  ? Multiple stones 1) MP= 0.47cm 2) MP= 0.46cm  Bladder:  PVR= 24.90ML.         ASSESSMENT:      ICD-10 Details  1 GU:   Ureteral calculus - P73.5   2   Renal colic - D89    PLAN:           Orders Labs Urine Culture          Schedule Return Visit/Planned Activity: ASAP - Schedule Surgery          Document Letter(s):  Created for Patient: Clinical Summary         Notes:   This patient is currently asymptomatic, and his urinalysis is negative. However, KUB indicates an approximate 5-6 mm mid left ureteral stone, and renal sono indicates hydronephrosis. The patient prefers ESWL. Dr. Gloriann Loan approves. Will plan for this. In the interim, I emphasized the importance of vigorous hydration, daily Flomax, and straining his urine. He was given strict instructions to notify the office if he experiences including but not limited to: Intolerable pain, fever, chills, nausea, or vomiting

## 2018-08-13 NOTE — Discharge Instructions (Signed)
See [facility] discharge instructions in chart.  

## 2018-08-13 NOTE — Op Note (Signed)
See [facility] OP note scanned into chart. Also because of the size, density, location and other factors that cannot be anticipated I feel this will likely be a staged procedure. This fact supersedes any indication in the scanned [facility] operative note to the contrary.  

## 2018-08-14 ENCOUNTER — Encounter (HOSPITAL_COMMUNITY): Payer: Self-pay | Admitting: Urology

## 2018-08-28 ENCOUNTER — Other Ambulatory Visit: Payer: Self-pay | Admitting: Family Medicine

## 2018-08-29 NOTE — Telephone Encounter (Signed)
Would Dr. Nolon Rod like to continue

## 2019-03-27 ENCOUNTER — Other Ambulatory Visit: Payer: Self-pay

## 2019-03-27 ENCOUNTER — Ambulatory Visit (INDEPENDENT_AMBULATORY_CARE_PROVIDER_SITE_OTHER): Payer: 59 | Admitting: Registered Nurse

## 2019-03-27 ENCOUNTER — Encounter: Payer: Self-pay | Admitting: Registered Nurse

## 2019-03-27 VITALS — BP 176/123 | HR 80 | Temp 97.1°F | Resp 16 | Ht 66.0 in | Wt 228.6 lb

## 2019-03-27 DIAGNOSIS — Z0001 Encounter for general adult medical examination with abnormal findings: Secondary | ICD-10-CM

## 2019-03-27 DIAGNOSIS — Z1329 Encounter for screening for other suspected endocrine disorder: Secondary | ICD-10-CM | POA: Diagnosis not present

## 2019-03-27 DIAGNOSIS — Z13220 Encounter for screening for lipoid disorders: Secondary | ICD-10-CM | POA: Diagnosis not present

## 2019-03-27 DIAGNOSIS — B356 Tinea cruris: Secondary | ICD-10-CM | POA: Diagnosis not present

## 2019-03-27 DIAGNOSIS — I10 Essential (primary) hypertension: Secondary | ICD-10-CM

## 2019-03-27 DIAGNOSIS — Z130 Encounter for screening for diseases of the blood and blood-forming organs and certain disorders involving the immune mechanism: Secondary | ICD-10-CM

## 2019-03-27 DIAGNOSIS — Z13228 Encounter for screening for other metabolic disorders: Secondary | ICD-10-CM

## 2019-03-27 MED ORDER — AMLODIPINE BESYLATE 5 MG PO TABS: 5.0000 mg | ORAL_TABLET | Freq: Every day | ORAL | 0 refills | Status: DC

## 2019-03-27 MED ORDER — CICLOPIROX 0.77 % EX GEL: 1.0000 "application " | Freq: Two times a day (BID) | CUTANEOUS | 1 refills | Status: DC

## 2019-03-27 NOTE — Progress Notes (Signed)
Established Patient Office Visit  Subjective:  Patient ID: Nathaniel Mosley, male    DOB: 1960-05-08  Age: 59 y.o. MRN: [id number]  CC:  Chief Complaint  Patient presents with  . Annual Exam    CPE  . Medication Refill    amlodipine, ketoconazole    HPI Nathaniel Mosley presents for CPE and labs  Concerned for jock itch - having rash in groin and armpits. Has been ongoing. Tries to keep good hygiene, avoids irritating products.   Has run out of amlodipine. BP elevated on arrival today. 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   No further complaints.  Past Medical History:  Diagnosis Date  . Allergy   . History of kidney stones   . Hypertension   . Kidney stone on left side     Past Surgical History:  Procedure Laterality Date  . EXTRACORPOREAL SHOCK WAVE LITHOTRIPSY Left 08/13/2018   Procedure: EXTRACORPOREAL SHOCK WAVE LITHOTRIPSY (ESWL);  Surgeon: Ardis Hughs, MD;  Location: [facility];  Service: Urology;  Laterality: Left;  . FRACTURE SURGERY     right elbow- 20 + years ago  . OPEN REPAIR PERIARTICULAR FRACTURE / DISLOCATION ELBOW  1998  . PLANTAR FASCIA RELEASE Bilateral 2013  . 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 TONSILLECTOMY     as a child    Family History  Problem Relation Age of Onset  . Cancer Mother   . Diabetes Father     Social History   Socioeconomic History  . Marital status: Single    Spouse name: Not on file  . Number of children: 0  . Years of education: Not on file  . Highest education level: Not on file  Occupational History  . Occupation: Engineer, civil (consulting): [employer]  Tobacco Use  . Smoking status: Never Smoker  . Smokeless tobacco: Never Used  Substance and Sexual Activity  . Alcohol use: No  . Drug use: No  . Sexual  activity: Not Currently    Birth control/protection: Abstinence  Other Topics Concern  . Not on file  Social History Narrative  . Not on file   Social Determinants of Health   Financial Resource Strain:   . Difficulty of Paying Living Expenses: Not on file  Food Insecurity:   . Worried About Charity fundraiser in the Last Year: Not on file  . Ran Out of Food in the Last Year: Not on file  Transportation Needs:   . Lack of Transportation (Medical): Not on file  . Lack of Transportation (Non-Medical): Not on file  Physical Activity:   . Days of Exercise per Week: Not on file  . Minutes of Exercise per Session: Not on file  Stress:   . Feeling of Stress : Not on file  Social Connections:   . Frequency of Communication with Friends and Family: Not on file  . Frequency of Social Gatherings with Friends and Family: Not on file  . Attends Religious Services: Not on file  . Active Member of Clubs or Organizations: Not on file  . Attends Archivist Meetings: Not on file  . Marital Status: Not on file  Intimate Partner Violence:   . Fear of Current or Ex-Partner: Not on file  . Emotionally Abused:  Not on file  . Physically Abused: Not on file  . Sexually Abused: Not on file    Outpatient Medications Prior to Visit  Medication Sig Dispense Refill  . amLODipine (NORVASC) 5 MG tablet Take 1 tablet (5 mg total) by mouth daily. 90 tablet 4  . aspirin EC 81 MG tablet Take 81 mg by mouth daily.    Marland Kitchen ibuprofen (ADVIL,MOTRIN) 600 MG tablet Take 1 tablet (600 mg total) by mouth every 6 (six) hours as needed. 30 tablet 0  . ketoconazole (NIZORAL) 2 % cream APPLY TO AFFECTED AREA EVERY DAY 30 g 0  . tamsulosin (FLOMAX) 0.4 MG CAPS capsule Take 0.4 mg by mouth.    . diclofenac (VOLTAREN) 75 MG EC tablet Take 1 tablet (75 mg total) by mouth 2 (two) times daily. (Patient not taking: Reported on 06/01/2018) 50 tablet 2   No facility-administered medications prior to visit.    Allergies   Allergen Reactions  . Azithromycin Other (See Comments)    GI upset    ROS Review of Systems  Constitutional: Negative.   HENT: Negative.   Eyes: Negative.   Respiratory: Negative.   Cardiovascular: Negative.   Gastrointestinal: Negative.   Endocrine: Negative.   Genitourinary: Negative.   Musculoskeletal: Negative.   Skin: Positive for rash (tinea, axilla and groin).  Allergic/Immunologic: Negative.   Neurological: Negative.   Hematological: Negative.   Psychiatric/Behavioral: Negative.   All other systems reviewed and are negative.     Objective:    Physical Exam  Constitutional: He is oriented to person, place, and time. He appears well-developed and well-nourished. No distress.  HENT:  Head: Normocephalic and atraumatic.  Right Ear: External ear normal.  Left Ear: External ear normal.  Nose: Nose normal.  Mouth/Throat: Oropharynx is clear and moist. No oropharyngeal exudate.  Eyes: Pupils are equal, round, and reactive to light. Conjunctivae and EOM are normal. Right eye exhibits no discharge. Left eye exhibits no discharge. No scleral icterus.  Neck: No JVD present. No tracheal deviation present. No thyromegaly present.  Cardiovascular: Normal rate, regular rhythm, normal heart sounds and intact distal pulses. Exam reveals no gallop and no friction rub.  No murmur heard. Pulmonary/Chest: Effort normal and breath sounds normal. No respiratory distress. He has no wheezes. He has no rales. He exhibits no tenderness.  Abdominal: Soft. Bowel sounds are normal. He exhibits mass (umbilical hernia, 3cm diameter). He exhibits no distension. There is no abdominal tenderness. There is no rebound and no guarding.  Musculoskeletal:        General: No tenderness, deformity or edema. Normal range of motion.     Cervical back: Normal range of motion and neck supple.  Lymphadenopathy:    He has no cervical adenopathy.  Neurological: He is alert and oriented to person, place, and  time. No cranial nerve deficit. He exhibits normal muscle tone. Coordination normal.  Skin: Skin is warm and dry. Rash (tinea in groin and axilla) noted. He is not diaphoretic. No erythema. No pallor.  Psychiatric: He has a normal mood and affect. His behavior is normal. Judgment and thought content normal.  Nursing note and vitals reviewed.   BP (!) 176/123   Pulse 80   Temp (!) 97.1 F (36.2 C) (Temporal)   Resp 16   Ht 5\' 6"  (1.676 m)   Wt 228 lb 9.6 oz (103.7 kg)   SpO2 96%   BMI 36.90 kg/m  Wt Readings from Last 3 Encounters:  03/27/19 228 lb 9.6 oz (  103.7 kg)  08/13/18 217 lb 12.7 oz (98.8 kg)  07/25/18 220 lb (99.8 kg)     There are no preventive care reminders to display for this patient.  There are no preventive care reminders to display for this patient.  Lab Results  Component Value Date   TSH 2.760 01/12/2018   Lab Results  Component Value Date   WBC 7.9 02/05/2018   HGB 15.4 02/05/2018   HCT 46.7 02/05/2018   MCV 87.3 02/05/2018   PLT 238 02/05/2018   Lab Results  Component Value Date   NA 140 02/05/2018   K 3.9 02/05/2018   CO2 29 02/05/2018   GLUCOSE 143 (H) 02/05/2018   BUN 18 02/05/2018   CREATININE 1.21 02/05/2018   BILITOT 1.2 02/05/2018   ALKPHOS 65 02/05/2018   AST 32 02/05/2018   ALT 11 02/05/2018   PROT 6.9 02/05/2018   ALBUMIN 4.3 02/05/2018   CALCIUM 9.3 02/05/2018   ANIONGAP 11 02/05/2018   Lab Results  Component Value Date   CHOL 184 01/12/2018   Lab Results  Component Value Date   HDL 34 (L) 01/12/2018   Lab Results  Component Value Date   LDLCALC 107 (H) 01/12/2018   Lab Results  Component Value Date   TRIG 217 (H) 01/12/2018   Lab Results  Component Value Date   CHOLHDL 5.4 (H) 01/12/2018   Lab Results  Component Value Date   HGBA1C 5.8 (A) 01/12/2018      Assessment & Plan:   Problem List Items Addressed This Visit    None    Visit Diagnoses    Screening for endocrine, metabolic and immunity  disorder    -  Primary   Relevant Orders   TSH   Hemoglobin A1c   Comprehensive metabolic panel   CBC   Lipid screening       Relevant Orders   Lipid Panel      No orders of the defined types were placed in this encounter.   Follow-up: Return in about 2 weeks (around 04/10/2019) for BP check.   PLAN  BP elevated - will resume amlodipine, he will return in 2 weeks for BP check. If still elevated at that time, double amlodipine to 10mg  PO qd.   Labs drawn today, will follow up as warranted  Umbilical hernia noted on exam. Patient states it is stable and doesn't irritate him. May consider repair in coming months  Otherwise no concerns.  Patient encouraged to call clinic with any questions, comments, or concerns.  Maximiano Coss, NP

## 2019-03-27 NOTE — Patient Instructions (Signed)
° ° ° °  If you have lab work done today you will be contacted with your lab results within the next 2 weeks.  If you have not heard from us then please contact us. The fastest way to get your results is to register for My Chart. ° ° °IF you received an x-ray today, you will receive an invoice from [facility]. Please contact [facility] at [phone] with questions or concerns regarding your invoice.  ° °IF you received labwork today, you will receive an invoice from [facility]. Please contact [facility] at [phone] with questions or concerns regarding your invoice.  ° °Our billing staff will not be able to assist you with questions regarding bills from these companies. ° °You will be contacted with the lab results as soon as they are available. The fastest way to get your results is to activate your My Chart account. Instructions are located on the last page of this paperwork. If you have not heard from us regarding the results in 2 weeks, please contact this office. °  ° ° ° °

## 2019-03-28 LAB — COMPREHENSIVE METABOLIC PANEL
ALT: 14 IU/L (ref 0–44)
AST: 20 IU/L (ref 0–40)
Albumin/Globulin Ratio: 1.7 (ref 1.2–2.2)
Albumin: 4.4 g/dL (ref 3.8–4.9)
Alkaline Phosphatase: 71 IU/L (ref 39–117)
BUN/Creatinine Ratio: 16 (ref 9–20)
BUN: 15 mg/dL (ref 6–24)
Bilirubin Total: 0.5 mg/dL (ref 0.0–1.2)
CO2: 23 mmol/L (ref 20–29)
Calcium: 9.2 mg/dL (ref 8.7–10.2)
Chloride: 103 mmol/L (ref 96–106)
Creatinine, Ser: 0.92 mg/dL (ref 0.76–1.27)
GFR calc Af Amer: 106 mL/min/{1.73_m2} (ref >59)
GFR calc non Af Amer: 92 mL/min/{1.73_m2} (ref >59)
Globulin, Total: 2.6 g/dL (ref 1.5–4.5)
Glucose: 103 mg/dL — ABNORMAL HIGH (ref 65–99)
Potassium: 4 mmol/L (ref 3.5–5.2)
Sodium: 141 mmol/L (ref 134–144)
Total Protein: 7 g/dL (ref 6.0–8.5)

## 2019-03-28 LAB — CBC
Hematocrit: 46.8 % (ref 37.5–51.0)
Hemoglobin: 15.8 g/dL (ref 13.0–17.7)
MCH: 29 pg (ref 26.6–33.0)
MCHC: 33.8 g/dL (ref 31.5–35.7)
MCV: 86 fL (ref 79–97)
Platelets: 234 10*3/uL (ref 150–450)
RBC: 5.45 x10E6/uL (ref 4.14–5.80)
RDW: 14.2 % (ref 11.6–15.4)
WBC: 5.7 10*3/uL (ref 3.4–10.8)

## 2019-03-28 LAB — LIPID PANEL
Chol/HDL Ratio: 5.1 ratio — ABNORMAL HIGH (ref 0.0–5.0)
Cholesterol, Total: 169 mg/dL (ref 100–199)
HDL: 33 mg/dL — ABNORMAL LOW (ref >39)
LDL Chol Calc (NIH): 105 mg/dL — ABNORMAL HIGH (ref 0–99)
Triglycerides: 177 mg/dL — ABNORMAL HIGH (ref 0–149)
VLDL Cholesterol Cal: 31 mg/dL (ref 5–40)

## 2019-03-28 LAB — TSH: TSH: 2.54 u[IU]/mL (ref 0.450–4.500)

## 2019-03-28 LAB — HEMOGLOBIN A1C
Est. average glucose Bld gHb Est-mCnc: 131 mg/dL
Hgb A1c MFr Bld: 6.2 % — ABNORMAL HIGH (ref 4.8–5.6)

## 2019-03-29 ENCOUNTER — Encounter: Payer: Self-pay | Admitting: Registered Nurse

## 2019-04-10 ENCOUNTER — Encounter: Payer: Self-pay | Admitting: Registered Nurse

## 2019-04-10 ENCOUNTER — Ambulatory Visit: Payer: 59 | Admitting: Registered Nurse

## 2019-04-10 ENCOUNTER — Other Ambulatory Visit: Payer: Self-pay

## 2019-04-10 VITALS — BP 150/90 | HR 75 | Temp 97.8°F | Ht 66.0 in | Wt 232.0 lb

## 2019-04-10 DIAGNOSIS — I10 Essential (primary) hypertension: Secondary | ICD-10-CM

## 2019-04-10 MED ORDER — AMLODIPINE BESYLATE 10 MG PO TABS: 10.0000 mg | ORAL_TABLET | Freq: Every day | ORAL | 1 refills | Status: DC

## 2019-04-10 NOTE — Progress Notes (Signed)
Nurse visit for BP check.  BP 148/90 on manual.  Will increase amlodipine to 10mg  Po qd  Recheck with provider visit in 3-6 mos  Kathrin Ruddy, NP

## 2019-04-10 NOTE — Patient Instructions (Signed)
° ° ° °  If you have lab work done today you will be contacted with your lab results within the next 2 weeks.  If you have not heard from us then please contact us. The fastest way to get your results is to register for My Chart. ° ° °IF you received an x-ray today, you will receive an invoice from [facility]. Please contact [facility] at [phone] with questions or concerns regarding your invoice.  ° °IF you received labwork today, you will receive an invoice from [facility]. Please contact [facility] at [phone] with questions or concerns regarding your invoice.  ° °Our billing staff will not be able to assist you with questions regarding bills from these companies. ° °You will be contacted with the lab results as soon as they are available. The fastest way to get your results is to activate your My Chart account. Instructions are located on the last page of this paperwork. If you have not heard from us regarding the results in 2 weeks, please contact this office. °  ° ° ° °

## 2019-07-02 IMAGING — CT CT RENAL STONE PROTOCOL
2 of 4 series · 16 of 46 positions shown, 18 images · non-contrast
Comparison: CT scan of August 19, 2003.

CLINICAL DATA: Acute left flank pain.

EXAM:
CT ABDOMEN AND PELVIS WITHOUT CONTRAST
TECHNIQUE: Multidetector CT imaging of the abdomen and pelvis was performed
following the standard protocol without IV contrast.

[Series 3: renal stone 5.0 · axial · 0.81mm/px · z∈[+725,+1165]mm · 13 of 96 slices shown, 15 images]
[im 4/96  soft-tissue]
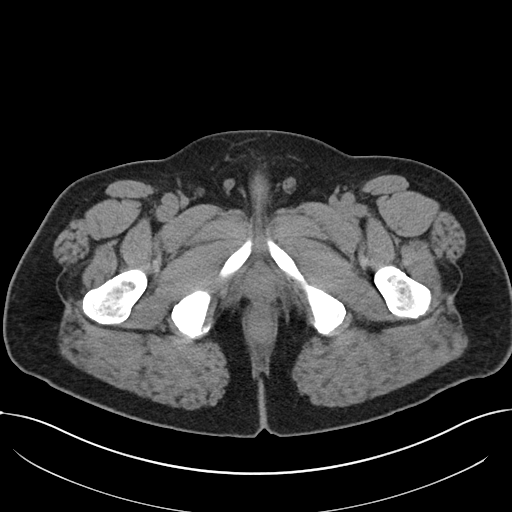
[im 4/96  bone]
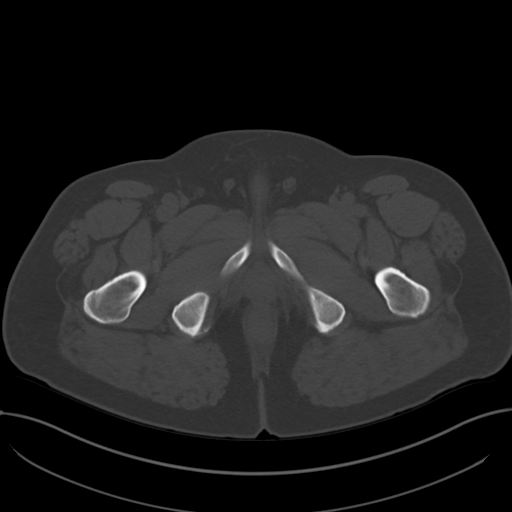
[im 12/96  soft-tissue]
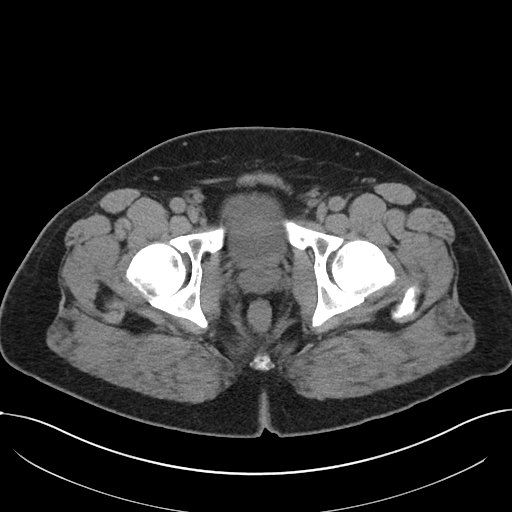
[im 20/96  soft-tissue]
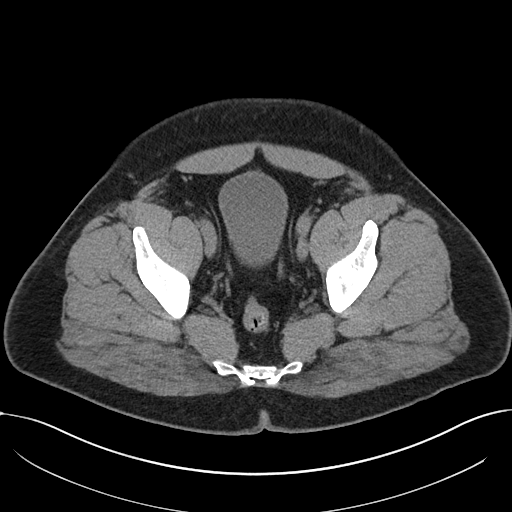
[im 28/96  soft-tissue]
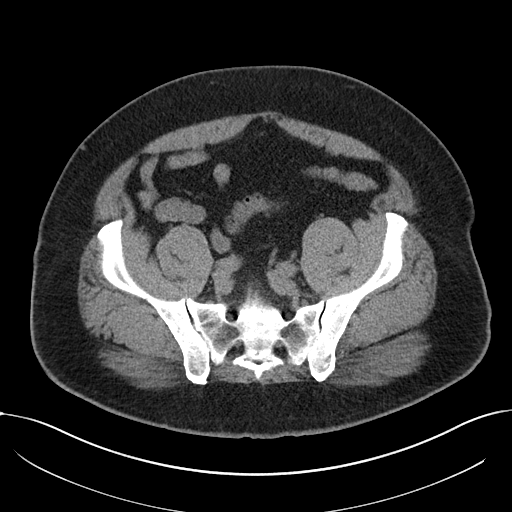
[im 32/96  soft-tissue]
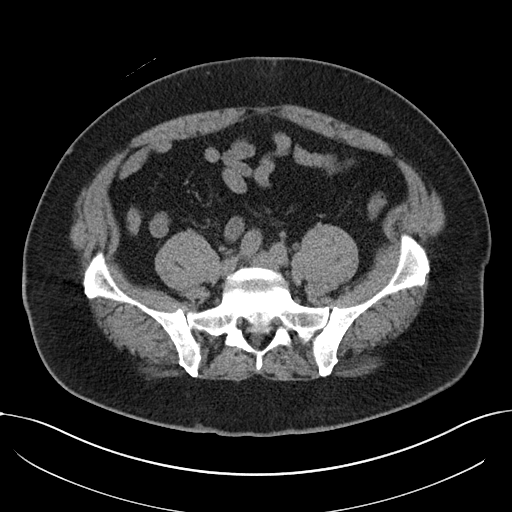
[im 40/96  soft-tissue]
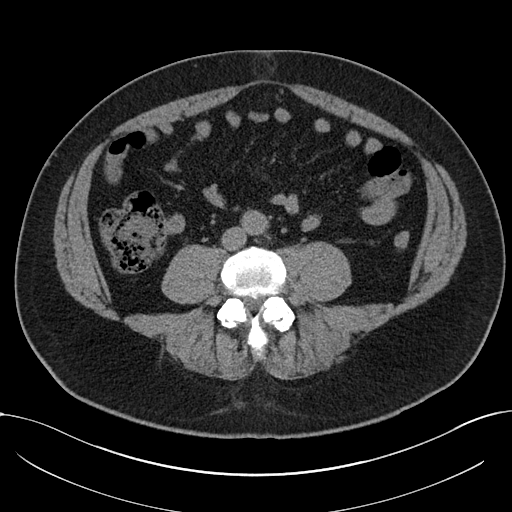
[im 48/96  soft-tissue]
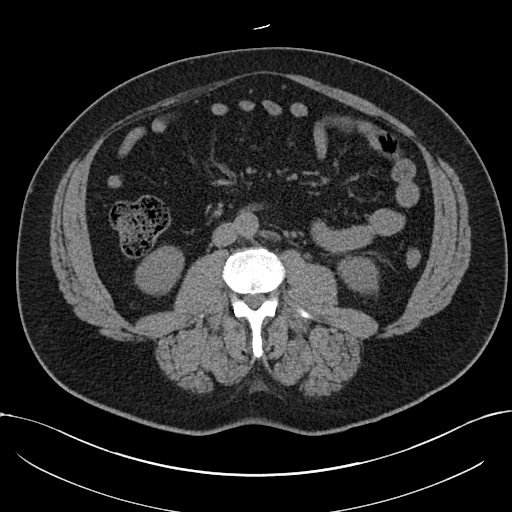
[im 56/96  soft-tissue]
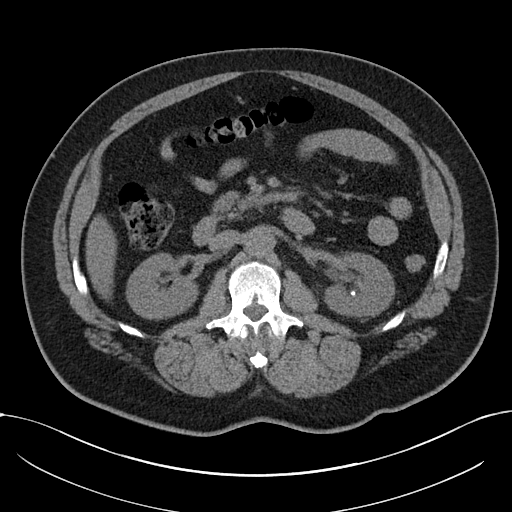
[im 64/96  soft-tissue]
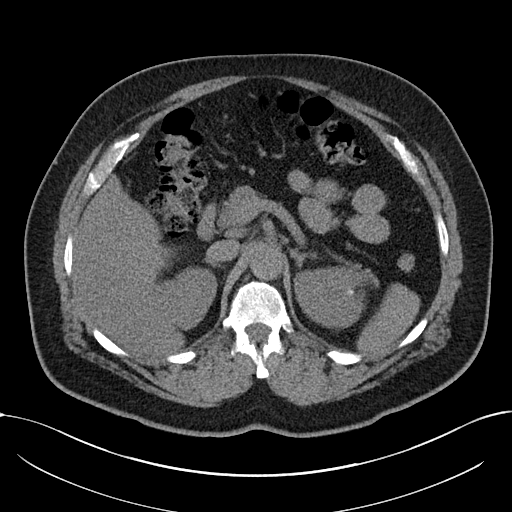
[im 64/96  bone]
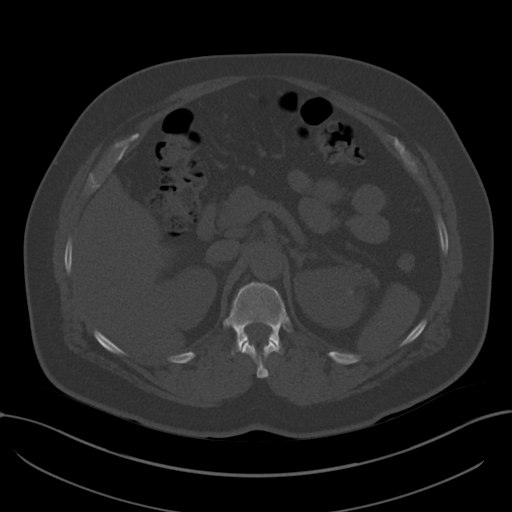
[im 68/96  soft-tissue]
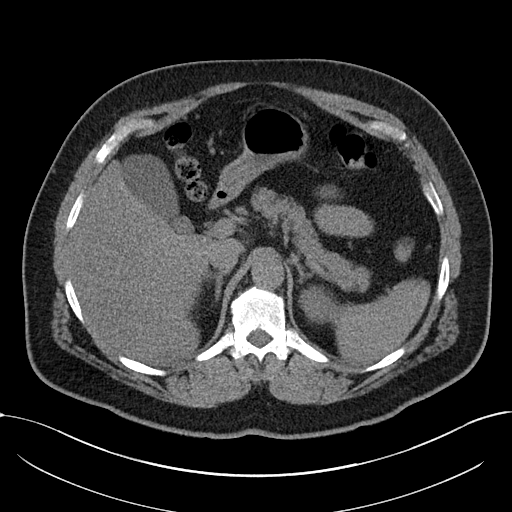
[im 76/96  soft-tissue]
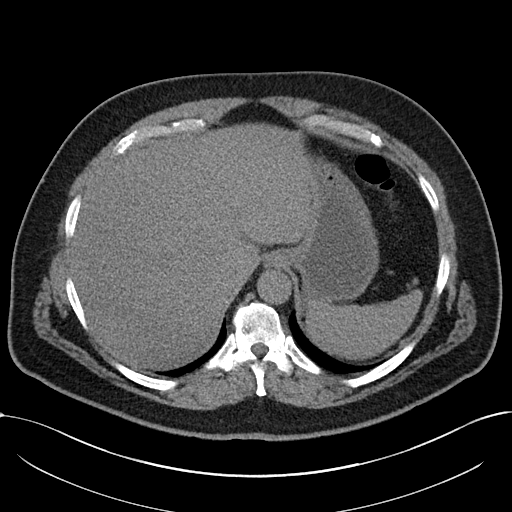
[im 84/96  soft-tissue]
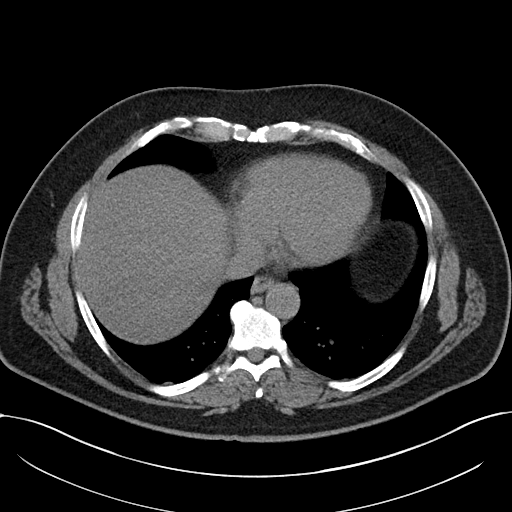
[im 92/96  soft-tissue]
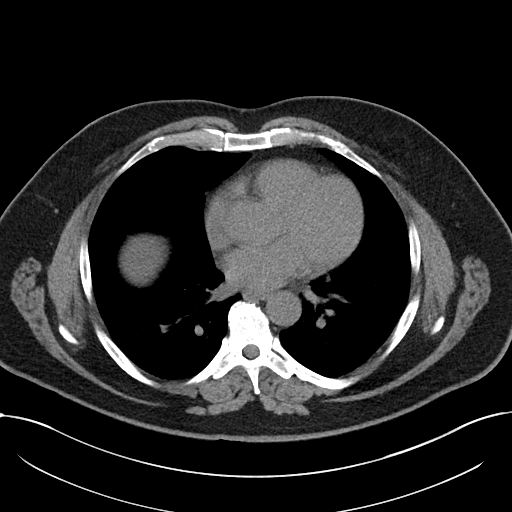

[Series 5: renal stone 3.0 cor · coronal · 0.93mm/px · 3 of 108 slices shown]
[im 36/108  soft-tissue]
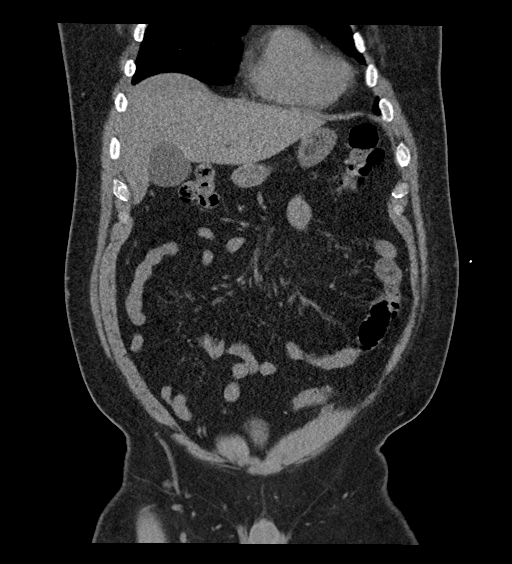
[im 48/108  soft-tissue]
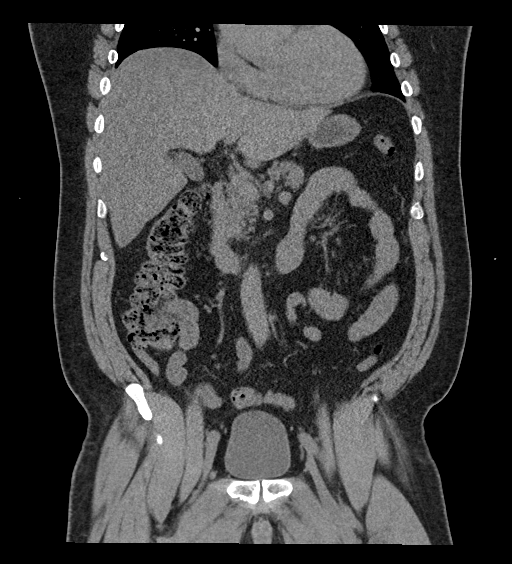
[im 60/108  soft-tissue]
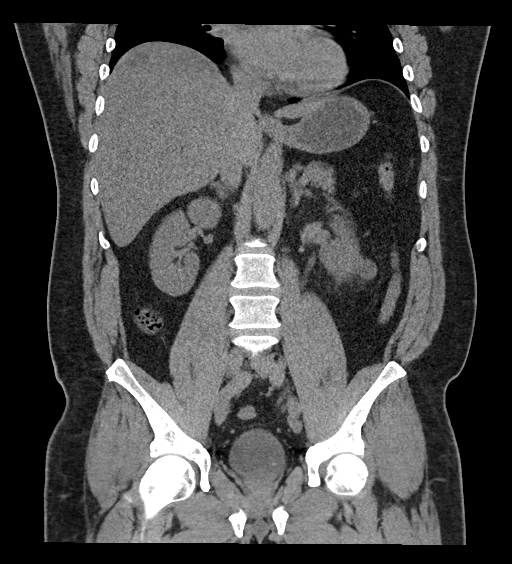

[16 of 46 positions shown; findings below may reference images not displayed]

FINDINGS: Lower chest: No acute abnormality.

Hepatobiliary: 2.6 cm rounded low density is seen in dome of right
hepatic lobe with Hounsfield measurement of approximately 0 most
consistent with cyst or hemangioma. No gallstones are noted.

Pancreas: Unremarkable. No pancreatic ductal dilatation or
surrounding inflammatory changes.

Spleen: Normal in size without focal abnormality.

Adrenals/Urinary Tract: Adrenal glands are unremarkable. Bilateral
nephrolithiasis is noted, with largest calculus measuring 8 mm in
lower pole of left kidney. Mild left hydroureteronephrosis is noted
secondary to 4 mm calculus at the left ureterovesical junction.
Bladder is otherwise unremarkable.

Stomach/Bowel: Stomach is within normal limits. Appendix appears
normal. No evidence of bowel wall thickening, distention, or
inflammatory changes.

Vascular/Lymphatic: No significant vascular findings are present. No
enlarged abdominal or pelvic lymph nodes.

Reproductive: Prostate is unremarkable.

Other: Moderate size fat containing periumbilical hernia is noted.
No abnormal fluid collection is noted.

Musculoskeletal: No acute or significant osseous findings.
IMPRESSION: Bilateral nephrolithiasis. Mild left hydroureteronephrosis is noted
secondary to 4 mm calculus at the left ureterovesical junction.

Moderate size fat containing periumbilical hernia.

## 2019-07-16 ENCOUNTER — Other Ambulatory Visit: Payer: Self-pay | Admitting: Registered Nurse

## 2019-07-16 DIAGNOSIS — B356 Tinea cruris: Secondary | ICD-10-CM

## 2019-07-17 ENCOUNTER — Other Ambulatory Visit: Payer: Self-pay | Admitting: *Deleted

## 2019-08-16 DIAGNOSIS — M6530 Trigger finger, unspecified finger: Secondary | ICD-10-CM | POA: Insufficient documentation

## 2019-10-08 ENCOUNTER — Ambulatory Visit: Payer: 59 | Admitting: Registered Nurse

## 2019-10-08 ENCOUNTER — Other Ambulatory Visit: Payer: Self-pay

## 2019-10-08 ENCOUNTER — Encounter: Payer: Self-pay | Admitting: Registered Nurse

## 2019-10-08 VITALS — BP 126/80 | HR 70 | Temp 98.0°F | Resp 18 | Ht 66.0 in | Wt 229.0 lb

## 2019-10-08 DIAGNOSIS — I10 Essential (primary) hypertension: Secondary | ICD-10-CM

## 2019-10-08 DIAGNOSIS — R7303 Prediabetes: Secondary | ICD-10-CM

## 2019-10-08 MED ORDER — AMLODIPINE BESYLATE 10 MG PO TABS: 10.0000 mg | ORAL_TABLET | Freq: Every day | ORAL | 1 refills | Status: DC

## 2019-10-08 NOTE — Patient Instructions (Signed)
° ° ° °  If you have lab work done today you will be contacted with your lab results within the next 2 weeks.  If you have not heard from us then please contact us. The fastest way to get your results is to register for My Chart. ° ° °IF you received an x-ray today, you will receive an invoice from [facility]. Please contact [facility] at [phone] with questions or concerns regarding your invoice.  ° °IF you received labwork today, you will receive an invoice from [facility]. Please contact [facility] at [phone] with questions or concerns regarding your invoice.  ° °Our billing staff will not be able to assist you with questions regarding bills from these companies. ° °You will be contacted with the lab results as soon as they are available. The fastest way to get your results is to activate your My Chart account. Instructions are located on the last page of this paperwork. If you have not heard from us regarding the results in 2 weeks, please contact this office. °  ° ° ° °

## 2019-10-08 NOTE — Progress Notes (Signed)
Established Patient Office Visit  Subjective:  Patient ID: Nathaniel Mosley, male    DOB: 10/21/1960  Age: 59 y.o. MRN: [id number]  CC:  Chief Complaint  Patient presents with   Follow-up    6 months follow up for Hypertension. Per patient he has no other questions or concerns at this time.   Medication Refill    possible refill for Amlodipine    HPI Nathaniel Mosley presents for 6 mo follow up on htn  Taking amlodipine 10mg  PO qd - good compliance. No CV symptoms: denies headache, chest pain, shob, doe claudication, dependent edema.  No other concerns or complaints, feeling well at this time.   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   TONSILLECTOMY     as a child    Family History  Problem Relation Age of Onset   Cancer Mother    Diabetes Father     Social History   Socioeconomic History   Marital status: Single    Spouse name: Not on file   Number of children: 0   Years of education: Not on file   Highest education level: Not on file  Occupational History   Occupation: theater production    Employer: [employer]  Tobacco Use   Smoking status: Never Smoker   Smokeless tobacco: Never Used  Vaping Use   Vaping Use: Never used  Substance and Sexual Activity   Alcohol use: No   Drug use: No   Sexual activity: Not Currently    Birth  control/protection: Abstinence  Other Topics Concern   Not on file  Social History Narrative   Not on file   Social Determinants of Health   Financial Resource Strain:    Difficulty of Paying Living Expenses:   Food Insecurity:    Worried About Charity fundraiser in the Last Year:    Arboriculturist in the Last Year:   Transportation Needs:    Film/video editor (Medical):    Lack of Transportation (Non-Medical):   Physical Activity:    Days of Exercise per Week:    Minutes of Exercise per Session:   Stress:    Feeling of Stress :   Social Connections:    Frequency of Communication with Friends and Family:    Frequency of Social Gatherings with Friends and Family:    Attends Religious Services:    Active Member of Clubs or Organizations:    Attends Music therapist:    Marital Status:   Intimate Partner Violence:    Fear of Current or Ex-Partner:    Emotionally Abused:    Physically Abused:    Sexually Abused:     Outpatient Medications Prior to Visit  Medication Sig Dispense Refill   aspirin EC 81 MG tablet Take 81 mg by mouth daily.  Ciclopirox 0.77 % gel APPLY TO AFFECTED AREA TWICE A DAY 45 g 1   ibuprofen (ADVIL,MOTRIN) 600 MG tablet Take 1 tablet (600 mg total) by mouth every 6 (six) hours as needed. 30 tablet 0   ketoconazole (NIZORAL) 2 % cream APPLY TO AFFECTED AREA EVERY DAY 30 g 0   amLODipine (NORVASC) 10 MG tablet Take 1 tablet (10 mg total) by mouth daily. 90 tablet 1   tamsulosin (FLOMAX) 0.4 MG CAPS capsule Take 0.4 mg by mouth. (Patient not taking: Reported on 10/08/2019)     No facility-administered medications prior to visit.    Allergies  Allergen Reactions   Azithromycin Other (See Comments)    GI upset    ROS Review of Systems  Constitutional: Negative.   HENT: Negative.   Eyes: Negative.   Respiratory: Negative.   Cardiovascular: Negative.   Gastrointestinal: Negative.   Endocrine:  Negative.   Genitourinary: Negative.   Musculoskeletal: Negative.   Skin: Negative.   Allergic/Immunologic: Negative.   Neurological: Negative.   Hematological: Negative.   Psychiatric/Behavioral: Negative.   All other systems reviewed and are negative.     Objective:    Physical Exam Vitals and nursing note reviewed.  Constitutional:      General: He is not in acute distress.    Appearance: Normal appearance. He is obese. He is not ill-appearing, toxic-appearing or diaphoretic.  Cardiovascular:     Rate and Rhythm: Normal rate and regular rhythm.     Pulses: Normal pulses.     Heart sounds: Normal heart sounds. No murmur heard.  No friction rub. No gallop.   Pulmonary:     Effort: Pulmonary effort is normal. No respiratory distress.     Breath sounds: Normal breath sounds. No stridor. No wheezing, rhonchi or rales.  Chest:     Chest wall: No tenderness.  Skin:    General: Skin is warm and dry.     Capillary Refill: Capillary refill takes less than 2 seconds.     Coloration: Skin is not jaundiced or pale.     Findings: No bruising, erythema, lesion or rash.  Neurological:     General: No focal deficit present.     Mental Status: He is alert and oriented to person, place, and time. Mental status is at baseline.  Psychiatric:        Mood and Affect: Mood normal.        Behavior: Behavior normal.        Thought Content: Thought content normal.        Judgment: Judgment normal.     BP 126/80    Pulse 70    Temp 98 F (36.7 C) (Temporal)    Resp 18    Ht 5\' 6"  (1.676 m)    Wt 229 lb (103.9 kg)    SpO2 95%    BMI 36.96 kg/m  Wt Readings from Last 3 Encounters:  10/08/19 229 lb (103.9 kg)  04/10/19 232 lb (105.2 kg)  03/27/19 228 lb 9.6 oz (103.7 kg)     Health Maintenance Due  Topic Date Due   INFLUENZA VACCINE  09/29/2019    There are no preventive care reminders to display for this patient.  Lab Results  Component Value Date   TSH 2.540 03/27/2019   Lab  Results  Component Value Date   WBC 5.7 03/27/2019   HGB 15.8 03/27/2019   HCT 46.8 03/27/2019   MCV 86 03/27/2019   PLT 234 03/27/2019   Lab  Results  Component Value Date   NA 141 03/27/2019   K 4.0 03/27/2019   CO2 23 03/27/2019   GLUCOSE 103 (H) 03/27/2019   BUN 15 03/27/2019   CREATININE 0.92 03/27/2019   BILITOT 0.5 03/27/2019   ALKPHOS 71 03/27/2019   AST 20 03/27/2019   ALT 14 03/27/2019   PROT 7.0 03/27/2019   ALBUMIN 4.4 03/27/2019   CALCIUM 9.2 03/27/2019   ANIONGAP 11 02/05/2018   Lab Results  Component Value Date   CHOL 169 03/27/2019   Lab Results  Component Value Date   HDL 33 (L) 03/27/2019   Lab Results  Component Value Date   LDLCALC 105 (H) 03/27/2019   Lab Results  Component Value Date   TRIG 177 (H) 03/27/2019   Lab Results  Component Value Date   CHOLHDL 5.1 (H) 03/27/2019   Lab Results  Component Value Date   HGBA1C 6.2 (H) 03/27/2019      Assessment & Plan:   Problem List Items Addressed This Visit      Cardiovascular and Mediastinum   HTN (hypertension)   Relevant Medications   amLODipine (NORVASC) 10 MG tablet    Other Visit Diagnoses    Prediabetes    -  Primary      Meds ordered this encounter  Medications   amLODipine (NORVASC) 10 MG tablet    Sig: Take 1 tablet (10 mg total) by mouth daily.    Dispense:  90 tablet    Refill:  1    Follow-up: No follow-ups on file.   PLAN  Continue amlodipine  Follow up in 6 mo for cpe and labs  Return with any concerns  Patient encouraged to call clinic with any questions, comments, or concerns.  Maximiano Coss, NP

## 2019-11-06 ENCOUNTER — Other Ambulatory Visit: Payer: Self-pay | Admitting: Registered Nurse

## 2019-11-06 DIAGNOSIS — B356 Tinea cruris: Secondary | ICD-10-CM

## 2020-03-06 ENCOUNTER — Ambulatory Visit: Payer: 59 | Attending: Internal Medicine

## 2020-03-06 DIAGNOSIS — Z23 Encounter for immunization: Secondary | ICD-10-CM

## 2020-03-06 NOTE — Progress Notes (Signed)
   Covid-19 Vaccination Clinic  Name:  Nathaniel Mosley    MRN: [id number] DOB: January 23, 1961  03/06/2020  [name] was observed post Covid-19 immunization for 15 minutes without incident. He was provided with Vaccine Information Sheet and instruction to access the V-Safe system.   [name] was instructed to call 911 with any severe reactions post vaccine: Marland Kitchen Difficulty breathing  . Swelling of face and throat  . A fast heartbeat  . A bad rash all over body  . Dizziness and weakness   Immunizations Administered    Name Date Dose VIS Date Route   Pfizer COVID-19 Vaccine 03/06/2020  3:37 PM 0.3 mL 12/18/2019 Intramuscular   Manufacturer: [place]   Lot: [id number]   NDC: [address]-8

## 2020-03-09 ENCOUNTER — Other Ambulatory Visit: Payer: Self-pay | Admitting: Registered Nurse

## 2020-03-09 DIAGNOSIS — B356 Tinea cruris: Secondary | ICD-10-CM

## 2020-03-27 ENCOUNTER — Encounter: Payer: 59 | Admitting: Registered Nurse

## 2020-03-27 ENCOUNTER — Telehealth: Payer: Self-pay | Admitting: Registered Nurse

## 2020-03-29 ENCOUNTER — Other Ambulatory Visit: Payer: Self-pay | Admitting: Registered Nurse

## 2020-03-29 DIAGNOSIS — I10 Essential (primary) hypertension: Secondary | ICD-10-CM

## 2020-03-29 NOTE — Telephone Encounter (Signed)
Requested Prescriptions  Pending Prescriptions Disp Refills  . amLODipine (NORVASC) 10 MG tablet [Pharmacy Med Name: AMLODIPINE BESYLATE 10 MG TAB] 30 tablet 2    Sig: TAKE 1 TABLET BY MOUTH EVERY DAY     Cardiovascular:  Calcium Channel Blockers Passed - 03/29/2020 12:56 AM      Passed - Last BP in normal range    BP Readings from Last 1 Encounters:  10/08/19 126/80         Passed - Valid encounter within last 6 months    Recent Outpatient Visits          5 months ago Prediabetes   [facility], Delfino Lovett, NP   11 months ago Essential hypertension   [facility], Delfino Lovett, NP   1 year ago Screening for endocrine, metabolic and immunity disorder   [facility], Delfino Lovett, NP   1 year ago Back skin lesion   [facility], Ranell Patrick, MD   2 years ago Annual physical exam   [facility], Laurey Arrow, MD      Future Appointments            In 1 month Maximiano Coss, NP [facility] [place], [facility]

## 2020-05-12 ENCOUNTER — Other Ambulatory Visit: Payer: Self-pay

## 2020-05-12 ENCOUNTER — Ambulatory Visit (INDEPENDENT_AMBULATORY_CARE_PROVIDER_SITE_OTHER): Payer: 59 | Admitting: Registered Nurse

## 2020-05-12 DIAGNOSIS — Z0000 Encounter for general adult medical examination without abnormal findings: Secondary | ICD-10-CM

## 2020-05-13 LAB — CMP14+EGFR
ALT: 15 IU/L (ref 0–44)
AST: 19 IU/L (ref 0–40)
Albumin/Globulin Ratio: 1.5 (ref 1.2–2.2)
Albumin: 4.4 g/dL (ref 3.8–4.9)
Alkaline Phosphatase: 80 IU/L (ref 44–121)
BUN/Creatinine Ratio: 13 (ref 9–20)
BUN: 12 mg/dL (ref 6–24)
Bilirubin Total: 0.4 mg/dL (ref 0.0–1.2)
CO2: 27 mmol/L (ref 20–29)
Calcium: 9.1 mg/dL (ref 8.7–10.2)
Chloride: 101 mmol/L (ref 96–106)
Creatinine, Ser: 0.92 mg/dL (ref 0.76–1.27)
Globulin, Total: 2.9 g/dL (ref 1.5–4.5)
Glucose: 111 mg/dL — ABNORMAL HIGH (ref 65–99)
Potassium: 4.1 mmol/L (ref 3.5–5.2)
Sodium: 143 mmol/L (ref 134–144)
Total Protein: 7.3 g/dL (ref 6.0–8.5)
eGFR: 96 mL/min/{1.73_m2} (ref >59)

## 2020-05-13 LAB — LIPID PANEL
Chol/HDL Ratio: 6.1 ratio — ABNORMAL HIGH (ref 0.0–5.0)
Cholesterol, Total: 183 mg/dL (ref 100–199)
HDL: 30 mg/dL — ABNORMAL LOW (ref >39)
LDL Chol Calc (NIH): 113 mg/dL — ABNORMAL HIGH (ref 0–99)
Triglycerides: 225 mg/dL — ABNORMAL HIGH (ref 0–149)
VLDL Cholesterol Cal: 40 mg/dL (ref 5–40)

## 2020-05-13 LAB — CBC
Hematocrit: 48.9 % (ref 37.5–51.0)
Hemoglobin: 16.3 g/dL (ref 13.0–17.7)
MCH: 28.7 pg (ref 26.6–33.0)
MCHC: 33.3 g/dL (ref 31.5–35.7)
MCV: 86 fL (ref 79–97)
Platelets: 228 10*3/uL (ref 150–450)
RBC: 5.67 x10E6/uL (ref 4.14–5.80)
RDW: 14.1 % (ref 11.6–15.4)
WBC: 5.6 10*3/uL (ref 3.4–10.8)

## 2020-05-13 LAB — HEMOGLOBIN A1C
Est. average glucose Bld gHb Est-mCnc: 134 mg/dL
Hgb A1c MFr Bld: 6.3 % — ABNORMAL HIGH (ref 4.8–5.6)

## 2020-05-13 LAB — TSH: TSH: 3.31 u[IU]/mL (ref 0.450–4.500)

## 2020-05-19 ENCOUNTER — Other Ambulatory Visit: Payer: Self-pay

## 2020-05-19 ENCOUNTER — Encounter: Payer: Self-pay | Admitting: Registered Nurse

## 2020-05-19 ENCOUNTER — Ambulatory Visit (INDEPENDENT_AMBULATORY_CARE_PROVIDER_SITE_OTHER): Payer: 59 | Admitting: Registered Nurse

## 2020-05-19 VITALS — BP 131/88 | HR 68 | Temp 98.0°F | Resp 18 | Ht 66.0 in | Wt 231.2 lb

## 2020-05-19 DIAGNOSIS — Z0000 Encounter for general adult medical examination without abnormal findings: Secondary | ICD-10-CM

## 2020-05-19 NOTE — Patient Instructions (Signed)
° ° ° °  If you have lab work done today you will be contacted with your lab results within the next 2 weeks.  If you have not heard from us then please contact us. The fastest way to get your results is to register for My Chart. ° ° °IF you received an x-ray today, you will receive an invoice from [facility]. Please contact [facility] at [phone] with questions or concerns regarding your invoice.  ° °IF you received labwork today, you will receive an invoice from [facility]. Please contact [facility] at [phone] with questions or concerns regarding your invoice.  ° °Our billing staff will not be able to assist you with questions regarding bills from these companies. ° °You will be contacted with the lab results as soon as they are available. The fastest way to get your results is to activate your My Chart account. Instructions are located on the last page of this paperwork. If you have not heard from us regarding the results in 2 weeks, please contact this office. °  ° ° ° °

## 2020-06-23 ENCOUNTER — Other Ambulatory Visit: Payer: Self-pay | Admitting: Registered Nurse

## 2020-06-23 DIAGNOSIS — I10 Essential (primary) hypertension: Secondary | ICD-10-CM

## 2020-06-29 ENCOUNTER — Other Ambulatory Visit: Payer: Self-pay | Admitting: Registered Nurse

## 2020-06-29 DIAGNOSIS — B356 Tinea cruris: Secondary | ICD-10-CM

## 2020-06-29 NOTE — Telephone Encounter (Signed)
LFD 03/10/20 45g with 1 refill LOV 05/19/20 NOV none

## 2020-08-24 ENCOUNTER — Other Ambulatory Visit: Payer: Self-pay | Admitting: Registered Nurse

## 2020-08-24 DIAGNOSIS — B356 Tinea cruris: Secondary | ICD-10-CM

## 2020-09-17 ENCOUNTER — Other Ambulatory Visit: Payer: Self-pay | Admitting: Registered Nurse

## 2020-09-17 DIAGNOSIS — B356 Tinea cruris: Secondary | ICD-10-CM

## 2020-10-14 DIAGNOSIS — M7672 Peroneal tendinitis, left leg: Secondary | ICD-10-CM

## 2020-10-15 ENCOUNTER — Telehealth: Payer: Self-pay | Admitting: Podiatry

## 2020-10-15 NOTE — Telephone Encounter (Signed)
Pt dropped off 2 pr of orthotics to be refurbished. I will be sending them out.

## 2020-10-23 ENCOUNTER — Telehealth: Payer: Self-pay | Admitting: Podiatry

## 2020-10-23 NOTE — Telephone Encounter (Signed)
2 pr refurbished orthotics in.. pt aware ok to pick up.

## 2020-11-16 ENCOUNTER — Other Ambulatory Visit: Payer: Self-pay | Admitting: Registered Nurse

## 2020-11-16 DIAGNOSIS — B356 Tinea cruris: Secondary | ICD-10-CM

## 2021-01-03 NOTE — Progress Notes (Signed)
Established Patient Office Visit  Subjective:  Patient ID: Nathaniel Mosley, male    DOB: [birth year]  Age: 60 y.o. MRN: [id number]  CC:  Chief Complaint  Patient presents with   Annual Exam    Patient states he is here for an CPE. Patient has no other concerns.    HPI Nathaniel Mosley presents for CPE  No acute concerns  Histories reviewed and updated with patient.   Reviewed recent labs with patient.  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   TONSILLECTOMY     as a child    Family History  Problem Relation Age of Onset   Cancer Mother    Diabetes Father     Social History   Socioeconomic History   Marital status: Single    Spouse name: Not on file   Number of children: 0   Years of education: Not on file   Highest education level: Not on file  Occupational History   Occupation: theater production    Employer: [employer]  Tobacco Use   Smoking status: Never   Smokeless tobacco: Never  Vaping Use   Vaping Use: Never used  Substance and Sexual Activity   Alcohol use: No   Drug use: No   Sexual activity: Not Currently    Birth control/protection: Abstinence  Other Topics Concern   Not on file  Social History Narrative   Not on file   Social Determinants of Health   Financial Resource Strain: Not on file  Food Insecurity: Not on file  Transportation Needs: Not on  file  Physical Activity: Not on file  Stress: Not on file  Social Connections: Not on file  Intimate Partner Violence: Not on file    Outpatient Medications Prior to Visit  Medication Sig Dispense Refill   aspirin EC 81 MG tablet Take 81 mg by mouth daily.     ibuprofen (ADVIL,MOTRIN) 600 MG tablet Take 1 tablet (600 mg total) by mouth every 6 (six) hours as needed. 30 tablet 0   amLODipine (NORVASC) 10 MG tablet TAKE 1 TABLET BY MOUTH EVERY DAY 30 tablet 2   Ciclopirox 0.77 % gel APPLY TO AFFECTED AREA TWICE A DAY 45 g 1   ketoconazole (NIZORAL) 2 % cream APPLY TO AFFECTED AREA EVERY DAY (Patient not taking: Reported on 05/19/2020) 30 g 0   tamsulosin (FLOMAX) 0.4 MG CAPS capsule Take 0.4 mg by mouth. (Patient not taking: No sig reported)     No facility-administered medications prior to visit.    Allergies  Allergen Reactions   Azithromycin Other (See Comments)    GI upset    ROS Review of Systems  Constitutional: Negative.   HENT: Negative.    Eyes: Negative.   Respiratory: Negative.    Cardiovascular: Negative.   Gastrointestinal: Negative.   Genitourinary: Negative.  Musculoskeletal: Negative.   Skin: Negative.   Neurological: Negative.   Psychiatric/Behavioral: Negative.    All other systems reviewed and are negative.    Objective:    Physical Exam Vitals and nursing note reviewed.  Constitutional:      General: He is not in acute distress.    Appearance: Normal appearance. He is normal weight. He is not ill-appearing, toxic-appearing or diaphoretic.  HENT:     Head: Normocephalic and atraumatic.     Right Ear: Tympanic membrane, ear canal and external ear normal. There is no impacted cerumen.     Left Ear: Tympanic membrane, ear canal and external ear normal. There is no impacted cerumen.     Nose: Nose normal. No congestion or rhinorrhea.     Mouth/Throat:     Mouth: Mucous membranes are moist.     Pharynx: Oropharynx is clear. No oropharyngeal exudate  or posterior oropharyngeal erythema.  Eyes:     General: No scleral icterus.       Right eye: No discharge.        Left eye: No discharge.     Extraocular Movements: Extraocular movements intact.     Conjunctiva/sclera: Conjunctivae normal.     Pupils: Pupils are equal, round, and reactive to light.  Neck:     Vascular: No carotid bruit.  Cardiovascular:     Rate and Rhythm: Normal rate and regular rhythm.     Pulses: Normal pulses.     Heart sounds: Normal heart sounds. No murmur heard.   No friction rub. No gallop.  Pulmonary:     Effort: Pulmonary effort is normal. No respiratory distress.     Breath sounds: Normal breath sounds. No stridor. No wheezing, rhonchi or rales.  Chest:     Chest wall: No tenderness.  Abdominal:     General: Abdomen is flat. Bowel sounds are normal. There is no distension.     Palpations: Abdomen is soft. There is no mass.     Tenderness: There is no abdominal tenderness. There is no right CVA tenderness, left CVA tenderness, guarding or rebound.     Hernia: No hernia is present.  Musculoskeletal:        General: No swelling, tenderness, deformity or signs of injury. Normal range of motion.     Cervical back: Normal range of motion and neck supple. No rigidity or tenderness.     Right lower leg: No edema.     Left lower leg: No edema.  Lymphadenopathy:     Cervical: No cervical adenopathy.  Skin:    General: Skin is warm and dry.     Capillary Refill: Capillary refill takes less than 2 seconds.     Coloration: Skin is not jaundiced or pale.     Findings: No bruising, erythema, lesion or rash.  Neurological:     General: No focal deficit present.     Mental Status: He is alert and oriented to person, place, and time. Mental status is at baseline.     Cranial Nerves: No cranial nerve deficit.     Motor: No weakness.     Gait: Gait normal.  Psychiatric:        Mood and Affect: Mood normal.        Behavior: Behavior normal.        Thought  Content: Thought content normal.        Judgment: Judgment normal.    BP 131/88   Pulse 68   Temp 98 F (36.7  C) (Temporal)   Resp 18   Ht '5\' 6"'  (1.676 m)   Wt 231 lb 3.2 oz (104.9 kg)   SpO2 94%   BMI 37.32 kg/m  Wt Readings from Last 3 Encounters:  05/19/20 231 lb 3.2 oz (104.9 kg)  10/08/19 229 lb (103.9 kg)  04/10/19 232 lb (105.2 kg)     Health Maintenance Due  Topic Date Due   Zoster Vaccines- Shingrix (1 of 2) Never done   COVID-19 Vaccine (4 - Booster for Pfizer series) 05/01/2020   INFLUENZA VACCINE  09/28/2020   TETANUS/TDAP  12/15/2020    There are no preventive care reminders to display for this patient.  Lab Results  Component Value Date   TSH 3.310 05/12/2020   Lab Results  Component Value Date   WBC 5.6 05/12/2020   HGB 16.3 05/12/2020   HCT 48.9 05/12/2020   MCV 86 05/12/2020   PLT 228 05/12/2020   Lab Results  Component Value Date   NA 143 05/12/2020   K 4.1 05/12/2020   CO2 27 05/12/2020   GLUCOSE 111 (H) 05/12/2020   BUN 12 05/12/2020   CREATININE 0.92 05/12/2020   BILITOT 0.4 05/12/2020   ALKPHOS 80 05/12/2020   AST 19 05/12/2020   ALT 15 05/12/2020   PROT 7.3 05/12/2020   ALBUMIN 4.4 05/12/2020   CALCIUM 9.1 05/12/2020   ANIONGAP 11 02/05/2018   EGFR 96 05/12/2020   Lab Results  Component Value Date   CHOL 183 05/12/2020   Lab Results  Component Value Date   HDL 30 (L) 05/12/2020   Lab Results  Component Value Date   LDLCALC 113 (H) 05/12/2020   Lab Results  Component Value Date   TRIG 225 (H) 05/12/2020   Lab Results  Component Value Date   CHOLHDL 6.1 (H) 05/12/2020   Lab Results  Component Value Date   HGBA1C 6.3 (H) 05/12/2020      Assessment & Plan:   Problem List Items Addressed This Visit   None Visit Diagnoses     Annual physical exam    -  Primary       No orders of the defined types were placed in this encounter.   Follow-up: No follow-ups on file.   PLAN Exam unremarkable Labs  reviewed Htn well controlled, continue current med regimen Return annually Patient encouraged to call clinic with any questions, comments, or concerns.  Maximiano Coss, NP

## 2021-01-23 ENCOUNTER — Other Ambulatory Visit: Payer: Self-pay | Admitting: Registered Nurse

## 2021-01-23 DIAGNOSIS — B356 Tinea cruris: Secondary | ICD-10-CM

## 2021-03-04 ENCOUNTER — Encounter: Payer: Self-pay | Admitting: Emergency Medicine

## 2021-03-04 ENCOUNTER — Ambulatory Visit
Admission: EM | Admit: 2021-03-04 | Discharge: 2021-03-04 | Disposition: A | Discharge status: Home / Self Care | Payer: 59 | Attending: Internal Medicine | Admitting: Internal Medicine

## 2021-03-04 ENCOUNTER — Other Ambulatory Visit: Payer: Self-pay

## 2021-03-04 DIAGNOSIS — U071 COVID-19: Secondary | ICD-10-CM

## 2021-03-04 MED ORDER — MOLNUPIRAVIR EUA 200MG CAPSULE: 4.0000 | ORAL_CAPSULE | Freq: Two times a day (BID) | ORAL | 0 refills | Status: DC

## 2021-03-04 NOTE — ED Provider Notes (Signed)
[facility]    CSN: [id number] Arrival date & time: 03/04/21  5361      History   Chief Complaint Chief Complaint  Patient presents with   Covid +    HPI Nathaniel Mosley is a 61 y.o. male.   Patient presents with cough and nasal congestion that started approximately 5 days ago.  Patient reports he took an at home COVID-19 test that was positive.  Denies any known fevers.  Denies chest pain, shortness of breath, sore throat, ear pain nausea, vomiting, diarrhea.  Patient has taken over-the-counter cold and flu medication with some improvement in symptoms.    Past Medical History:  Diagnosis Date   Allergy    History of kidney stones    Hypertension    Kidney stone on left side     Patient Active Problem List   Diagnosis Date Noted   Obesity 06/06/2016   Left lateral epicondylitis 12/09/2015   Seasonal allergies 07/07/2011   Kidney stone 07/07/2011   HTN (hypertension) 04/20/2011    Past Surgical History:  Procedure Laterality Date   EXTRACORPOREAL SHOCK WAVE LITHOTRIPSY Left 08/13/2018   Procedure: EXTRACORPOREAL SHOCK WAVE LITHOTRIPSY (ESWL);  Surgeon: Ardis Hughs, MD;  Location: [facility];  Service: Urology;  Laterality: Left;   FRACTURE SURGERY     right elbow- 20 + years ago   OPEN REPAIR PERIARTICULAR FRACTURE / [place] Bilateral 2013   right cheek  8 years ago   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 Authorizing Provider  molnupiravir EUA (LAGEVRIO) 200 mg CAPS capsule Take 4 capsules (800 mg total) by mouth 2 (two) times daily for 5 days. 03/04/21 03/09/21 Yes Gracelin Weisberg, Hildred Alamin E, FNP  amLODipine (NORVASC) 10 MG tablet TAKE 1 TABLET BY MOUTH EVERY DAY 06/23/20   Maximiano Coss, NP  aspirin EC 81 MG tablet Take 81 mg by mouth daily.    [provider]   Ciclopirox 0.77 % gel APPLY TO AFFECTED AREA TWICE A DAY 01/25/21   Maximiano Coss, NP  ibuprofen (ADVIL,MOTRIN) 600 MG tablet Take 1 tablet (600 mg total) by mouth every 6 (six) hours as needed. 02/05/18   Fatima Blank, MD  ketoconazole (NIZORAL) 2 % cream APPLY TO AFFECTED AREA EVERY DAY Patient not taking: Reported on 05/19/2020 06/07/18   Forrest Moron, MD  tamsulosin (FLOMAX) 0.4 MG CAPS capsule Take 0.4 mg by mouth. Patient not taking: No sig reported    [provider]    Family History Family History  Problem Relation Age of Onset   Cancer Mother    Diabetes Father     Social History Social History   Tobacco Use   Smoking status: Never   Smokeless tobacco: Never  Vaping Use   Vaping Use: Never used  Substance Use Topics   Alcohol use: No   Drug use: No     Allergies   Azithromycin   Review of Systems Review of Systems Per HPI  Physical Exam Triage Vital Signs ED Triage Vitals  Enc Vitals Group     BP 03/04/21 1104 (!) 144/89     Pulse Rate 03/04/21 1104 68     Resp 03/04/21 1104 16     Temp 03/04/21 1104 97.6 F (36.4 C)  Temp Source 03/04/21 1104 Oral     SpO2 03/04/21 1104 92 %     Weight --      Height --      Head Circumference --      Peak Flow --      Pain Score 03/04/21 1102 0     Pain Loc --      Pain Edu? --      Excl. in [place]? --    No data found.  Updated Vital Signs BP (!) 144/89 (BP Location: Left Arm)    Pulse 68    Temp 97.6 F (36.4 C) (Oral)    Resp 16    SpO2 92%   Visual Acuity Right Eye Distance:   Left Eye Distance:   Bilateral Distance:    Right Eye Near:   Left Eye Near:    Bilateral Near:     Physical Exam Constitutional:      General: He is not in acute distress.    Appearance: Normal appearance. He is not toxic-appearing or diaphoretic.  HENT:     Head: Normocephalic and atraumatic.     Right Ear: Tympanic membrane and ear canal normal.     Left Ear: Tympanic membrane and ear canal  normal.     Nose: Congestion present.     Mouth/Throat:     Mouth: Mucous membranes are moist.     Pharynx: No posterior oropharyngeal erythema.  Eyes:     Extraocular Movements: Extraocular movements intact.     Conjunctiva/sclera: Conjunctivae normal.     Pupils: Pupils are equal, round, and reactive to light.  Cardiovascular:     Rate and Rhythm: Normal rate and regular rhythm.     Pulses: Normal pulses.     Heart sounds: Normal heart sounds.  Pulmonary:     Effort: Pulmonary effort is normal. No respiratory distress.     Breath sounds: Normal breath sounds. No stridor. No wheezing, rhonchi or rales.  Abdominal:     General: Abdomen is flat. Bowel sounds are normal.     Palpations: Abdomen is soft.  Musculoskeletal:        General: Normal range of motion.     Cervical back: Normal range of motion.  Skin:    General: Skin is warm and dry.  Neurological:     General: No focal deficit present.     Mental Status: He is alert and oriented to person, place, and time. Mental status is at baseline.  Psychiatric:        Mood and Affect: Mood normal.        Behavior: Behavior normal.     UC Treatments / Results  Labs (all labs ordered are listed, but only abnormal results are displayed) Labs Reviewed - No data to display  EKG   Radiology No results found.  Procedures Procedures (including critical care time)  Medications Ordered in UC Medications - No data to display  Initial Impression / Assessment and Plan / UC Course  I have reviewed the triage vital signs and the nursing notes.  Pertinent labs & imaging results that were available during my care of the patient were reviewed by me and considered in my medical decision making (see chart for details).     Patient tested positive for COVID-19 with an at home test.  Discussed antiviral medication with the patient and he wished to have these prescribed.  Molnupiravir prescribed for patient.  Discussed supportive care  and symptom management with patient.  Patient verbalized understanding and was agreeable with plan. Final Clinical Impressions(s) / UC Diagnoses   Final diagnoses:  [id number]     Discharge Instructions      You have been prescribed an antiviral medication to treat COVID.    ED Prescriptions     Medication Sig Dispense Auth. Provider   molnupiravir EUA (LAGEVRIO) 200 mg CAPS capsule Take 4 capsules (800 mg total) by mouth 2 (two) times daily for 5 days. 40 capsule [place], Michele Rockers, [place]      PDMP not reviewed this encounter.   Teodora Medici, [place] 03/04/21 1133

## 2021-03-04 NOTE — ED Triage Notes (Signed)
Cough, nasal congestion, covid positive test at home since Sunday this week. Denies SOB, chest pain, N/V/D. Came to see if there were any meds he could take to help ease symptoms.

## 2021-03-04 NOTE — Discharge Instructions (Signed)
You have been prescribed an antiviral medication to treat COVID.

## 2021-03-12 ENCOUNTER — Other Ambulatory Visit: Payer: Self-pay

## 2021-03-12 ENCOUNTER — Ambulatory Visit: Payer: 59 | Admitting: Nurse Practitioner

## 2021-03-12 VITALS — BP 122/82 | HR 91 | Temp 97.8°F | Ht 66.0 in | Wt 226.0 lb

## 2021-03-12 DIAGNOSIS — B356 Tinea cruris: Secondary | ICD-10-CM | POA: Diagnosis not present

## 2021-03-12 DIAGNOSIS — Z6836 Body mass index (BMI) 36.0-36.9, adult: Secondary | ICD-10-CM

## 2021-03-12 DIAGNOSIS — I10 Essential (primary) hypertension: Secondary | ICD-10-CM | POA: Diagnosis not present

## 2021-03-12 DIAGNOSIS — L989 Disorder of the skin and subcutaneous tissue, unspecified: Secondary | ICD-10-CM

## 2021-03-12 DIAGNOSIS — K429 Umbilical hernia without obstruction or gangrene: Secondary | ICD-10-CM

## 2021-03-12 DIAGNOSIS — E6609 Other obesity due to excess calories: Secondary | ICD-10-CM | POA: Diagnosis not present

## 2021-03-12 DIAGNOSIS — R7303 Prediabetes: Secondary | ICD-10-CM

## 2021-03-12 DIAGNOSIS — Z1329 Encounter for screening for other suspected endocrine disorder: Secondary | ICD-10-CM

## 2021-03-12 DIAGNOSIS — Z125 Encounter for screening for malignant neoplasm of prostate: Secondary | ICD-10-CM

## 2021-03-12 MED ORDER — CLOTRIMAZOLE-BETAMETHASONE 1-0.05 % EX CREA: 1.0000 "application " | TOPICAL_CREAM | Freq: Every day | CUTANEOUS | 0 refills | Status: DC

## 2021-03-12 NOTE — Progress Notes (Signed)
Subjective:  Patient ID: Nathaniel Mosley, male    DOB: October 31, 1960  Age: 61 y.o. MRN: [id number]  CC:  Chief Complaint  Patient presents with   New Patient (Initial Visit)    Establish care, manage BP      HPI  This patient arrives today for the above.  His previous provider moved to a different office and this office is closer to where he lives so he wanted to establish care here.  Only concerns today is he reports an area of "jock itch" that he is having a hard time clearing up.  He also reports an umbilical hernia that is not bothersome at this time however he would like to discuss getting it repaired.  Lastly, he has a couple of skin lesions that he is concerned may be turning into cancer.  Overall he feels well today.  Last blood work was collected almost 1 year ago.  Per chart review I do see that his A1c was in the prediabetic range and cholesterol was elevated.  He has a history of hypertension and continues on his amlodipine.  He tolerates this well.  Past Medical History:  Diagnosis Date   Allergy    History of kidney stones    Hypertension    Kidney stone on left side       Family History  Problem Relation Age of Onset   Cancer Mother    Diabetes Father     Social History   Social History Narrative   Not on file   Social History   Tobacco Use   Smoking status: Never   Smokeless tobacco: Never  Substance Use Topics   Alcohol use: No     Current Meds  Medication Sig   amLODipine (NORVASC) 10 MG tablet TAKE 1 TABLET BY MOUTH EVERY DAY   aspirin EC 81 MG tablet Take 81 mg by mouth daily.   Ciclopirox 0.77 % gel APPLY TO AFFECTED AREA TWICE A DAY   clotrimazole-betamethasone (LOTRISONE) cream Apply 1 application topically daily.   ibuprofen (ADVIL,MOTRIN) 600 MG tablet Take 1 tablet (600 mg total) by mouth every 6 (six) hours as needed.    ROS:  Review of Systems  Constitutional:  Negative for fever and malaise/fatigue.   Respiratory:  Negative for shortness of breath.   Cardiovascular:  Negative for chest pain.  Gastrointestinal:  Negative for abdominal pain.  Neurological:  Negative for dizziness, loss of consciousness and headaches.    Objective:   Today's Vitals: BP 122/82    Pulse 91    Temp 97.8 F (36.6 C) (Oral)    Ht 5\' 6"  (1.676 m)    Wt 226 lb (102.5 kg)    SpO2 93%    BMI 36.48 kg/m  Vitals with BMI 03/12/2021 03/04/2021 05/19/2020  Height 5\' 6"  - 5\' 6"   Weight 226 lbs - 231 lbs 3 oz  BMI 52.77 - 82.42  Systolic 353 614 431  Diastolic 82 89 88  Pulse 91 68 68     Physical Exam Vitals reviewed.  Constitutional:      Appearance: Normal appearance.  HENT:     Head: Normocephalic and atraumatic.  Cardiovascular:     Rate and Rhythm: Normal rate and regular rhythm.  Pulmonary:     Effort: Pulmonary effort is normal.     Breath sounds: Normal breath sounds.  Abdominal:     General: Bowel sounds are decreased.     Palpations: Abdomen is soft.  Tenderness: There is no abdominal tenderness.     Hernia: A hernia is present. Hernia is present in the umbilical area (soft, easily reducible).  Musculoskeletal:     Cervical back: Neck supple.  Skin:    General: Skin is warm and dry.       Neurological:     Mental Status: He is alert and oriented to person, place, and time.  Psychiatric:        Mood and Affect: Mood normal.        Behavior: Behavior normal.        Thought Content: Thought content normal.        Judgment: Judgment normal.         Assessment and Plan   1. Tinea cruris   2. Primary hypertension   3. Class 2 obesity due to excess calories without serious comorbidity with body mass index (BMI) of 36.0 to 36.9 in adult   4. Thyroid disorder screen   5. Prediabetes   6. Prostate cancer screening   7. Umbilical hernia without obstruction and without gangrene   8. Skin lesion      Plan: 1.  Recommend he try Lotrisone cream.  We did discuss applying it to area  of concern twice a day for approximately 2 weeks. 2.  Blood pressure well controlled on current regimen he will continue on amlodipine. 3.-6.  We will check blood work for further evaluation today. 7.  Will refer to general surgery to discuss repair of hernia.  He was encouraged let me know if the area starts to cause some pain or discomfort. 8.  Skin lesion of concern appears consistent with some porokeratosis, reassured him that this is a benign finding.  However I did note another lesion as indicated on physical exam above that is concerning to me for possible squamous cell.  Referral to dermatology made today.   Tests ordered Orders Placed This Encounter  Procedures   TSH   Hemoglobin A1c   Lipid panel   Comprehensive metabolic panel   CBC with Differential/Platelet   PSA   Ambulatory referral to General Surgery   Ambulatory referral to Dermatology      Meds ordered this encounter  Medications   clotrimazole-betamethasone (LOTRISONE) cream    Sig: Apply 1 application topically daily.    Dispense:  30 g    Refill:  0    Order Specific Question:   Supervising Provider    Answer:   Binnie Rail [[id number]]    Patient to follow-up in 3 to 6 months for comprehensive physical exam, or sooner as needed.  Ailene Ards, NP

## 2021-03-12 NOTE — Patient Instructions (Signed)
Bivalent Covid Vaccine

## 2021-03-22 ENCOUNTER — Telehealth: Payer: Self-pay | Admitting: Physician Assistant

## 2021-03-22 NOTE — Telephone Encounter (Signed)
Patient is calling for a referral appointment from Jeralyn Ruths, NP.  Patient is scheduled for 10/06/2021 at 8:30 with [facility], PA-C.

## 2021-03-22 NOTE — Telephone Encounter (Signed)
Referral attached to appointment

## 2021-04-03 ENCOUNTER — Other Ambulatory Visit: Payer: Self-pay | Admitting: Registered Nurse

## 2021-04-03 DIAGNOSIS — B356 Tinea cruris: Secondary | ICD-10-CM

## 2021-04-03 DIAGNOSIS — I10 Essential (primary) hypertension: Secondary | ICD-10-CM

## 2021-04-08 NOTE — Telephone Encounter (Signed)
Erroneous encounter. Please disregard.

## 2021-05-22 IMAGING — CR ABDOMEN - 1 VIEW
2 series · 2 of 2 positions shown · non-contrast
Comparison: 01/26/2071

CLINICAL DATA: Left ureteral calculi.

EXAM:
ABDOMEN - 1 VIEW

[t abdomen supine (1 of 2)]
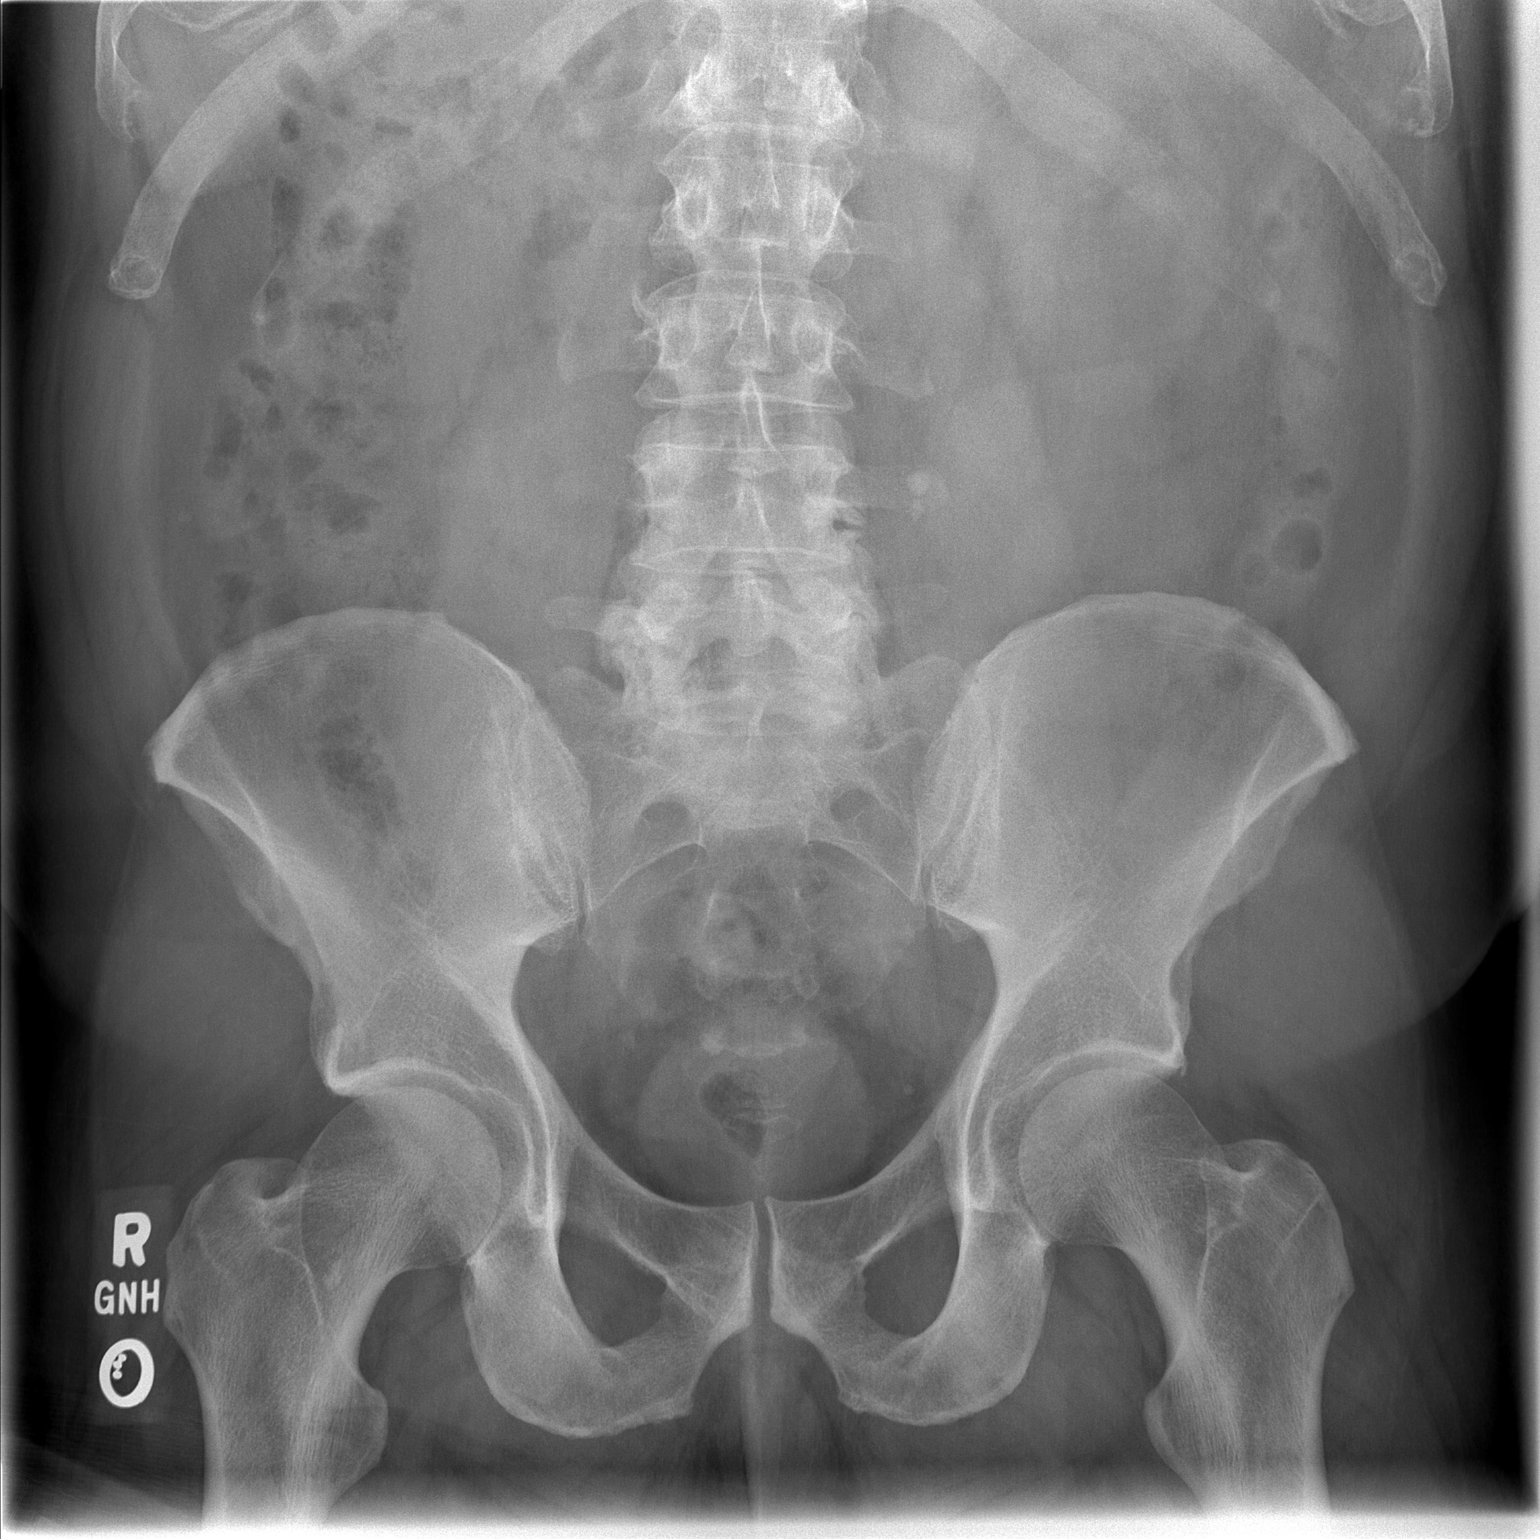

[t abdomen supine (2 of 2)]
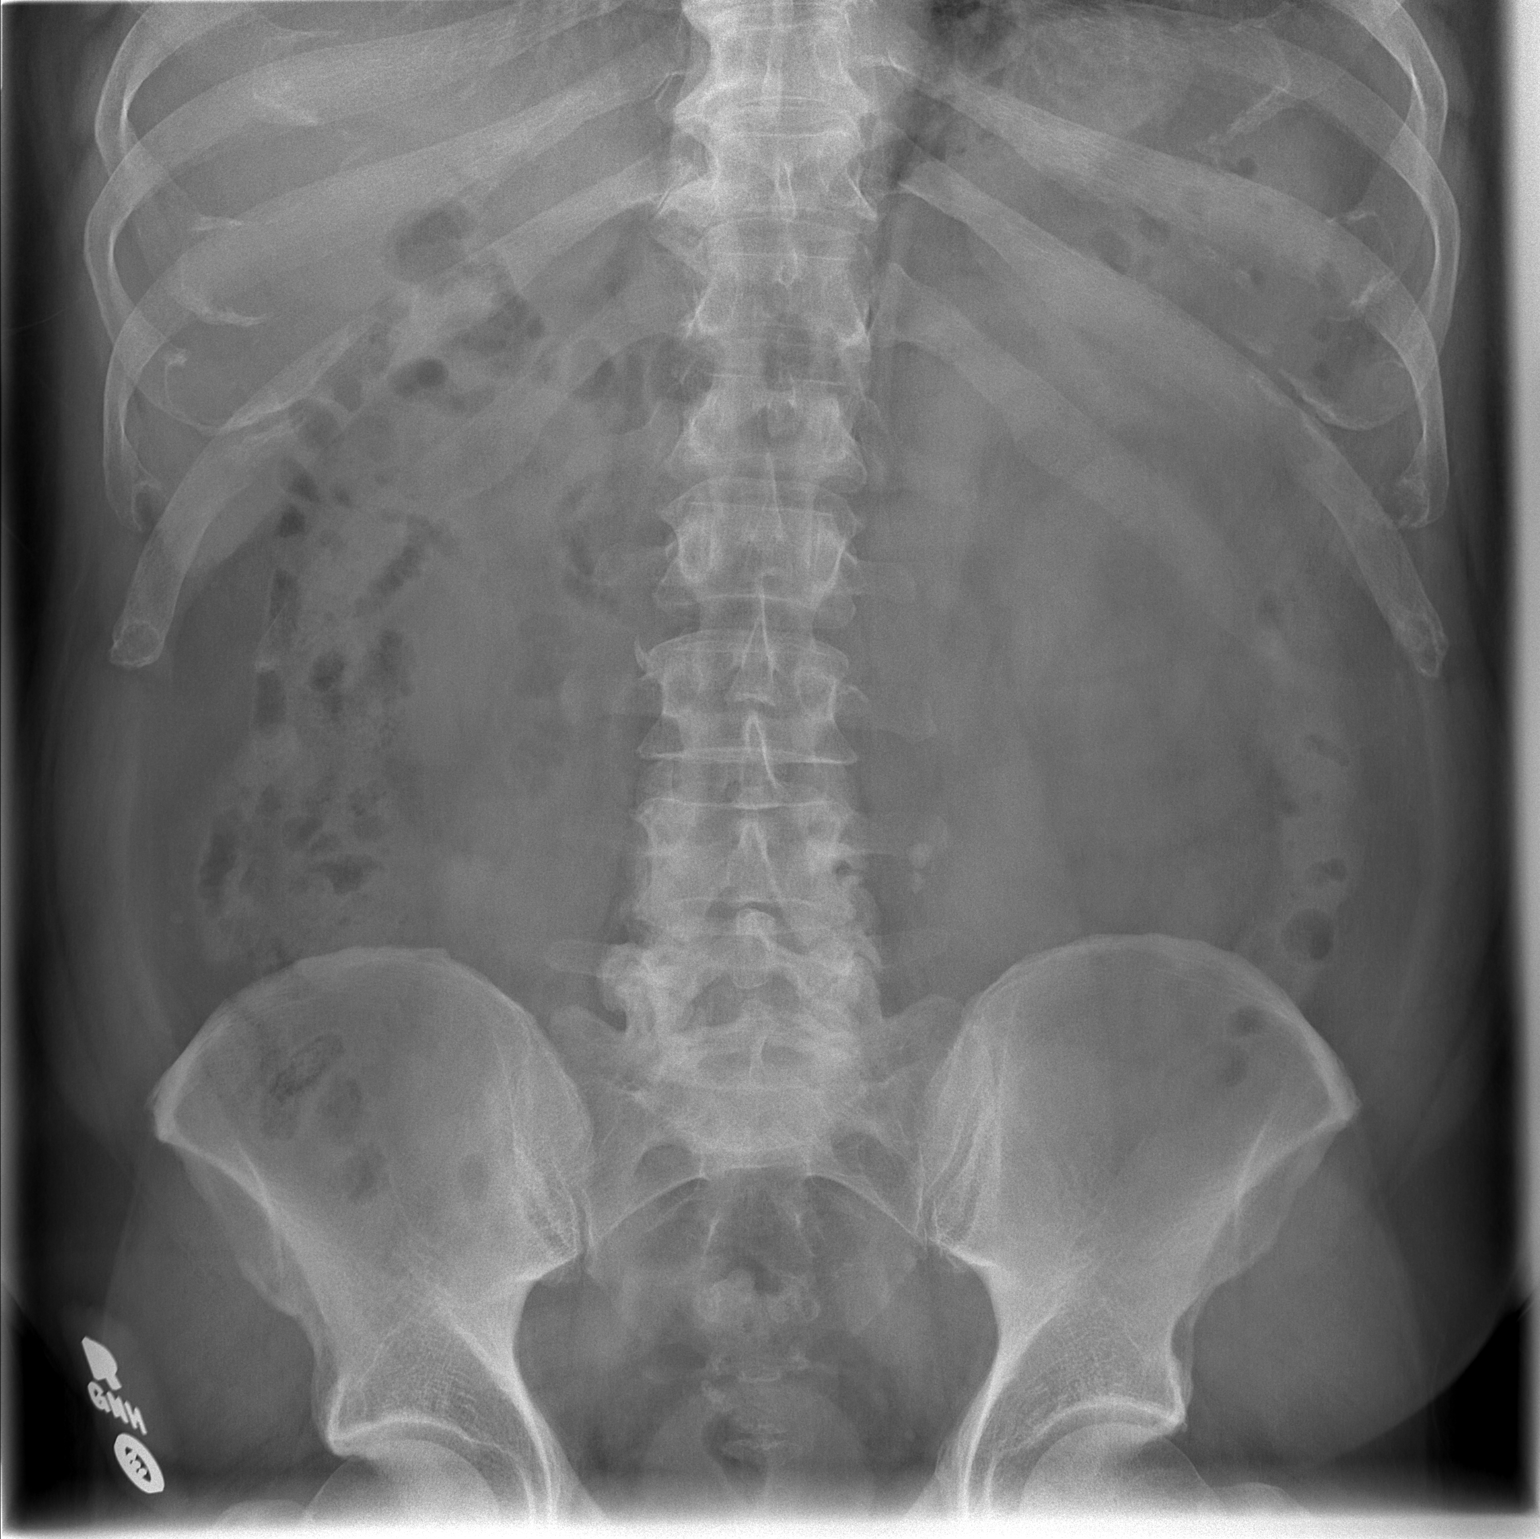

[2 of 2 positions shown; findings below may reference images not displayed]

FINDINGS: Normal bowel gas pattern. 2 stones are identified in the mid left
ureter in the projection the left L4 transverse process. The larger
of the 2 stones measures 7 mm. No additional renal calculi
identified.
IMPRESSION: 1. Left mid ureteral calculi are at the approximate L4 level.

## 2021-06-09 ENCOUNTER — Other Ambulatory Visit: Payer: Self-pay

## 2021-06-09 ENCOUNTER — Encounter (HOSPITAL_BASED_OUTPATIENT_CLINIC_OR_DEPARTMENT_OTHER): Payer: Self-pay | Admitting: Urology

## 2021-06-09 ENCOUNTER — Emergency Department (HOSPITAL_BASED_OUTPATIENT_CLINIC_OR_DEPARTMENT_OTHER): Payer: 59 | Admitting: Radiology

## 2021-06-09 ENCOUNTER — Ambulatory Visit
Admission: EM | Admit: 2021-06-09 | Discharge: 2021-06-09 | Disposition: A | Discharge status: Home / Self Care | Payer: 59

## 2021-06-09 ENCOUNTER — Emergency Department (HOSPITAL_BASED_OUTPATIENT_CLINIC_OR_DEPARTMENT_OTHER)
Admission: EM | Admit: 2021-06-09 | Discharge: 2021-06-09 | Disposition: A | Discharge status: Home / Self Care | Payer: 59 | Attending: Emergency Medicine | Admitting: Emergency Medicine

## 2021-06-09 DIAGNOSIS — J40 Bronchitis, not specified as acute or chronic: Secondary | ICD-10-CM | POA: Diagnosis not present

## 2021-06-09 DIAGNOSIS — R053 Chronic cough: Secondary | ICD-10-CM | POA: Diagnosis not present

## 2021-06-09 DIAGNOSIS — I10 Essential (primary) hypertension: Secondary | ICD-10-CM | POA: Diagnosis not present

## 2021-06-09 DIAGNOSIS — R7981 Abnormal blood-gas level: Secondary | ICD-10-CM

## 2021-06-09 DIAGNOSIS — Z20822 Contact with and (suspected) exposure to covid-19: Secondary | ICD-10-CM | POA: Insufficient documentation

## 2021-06-09 DIAGNOSIS — J069 Acute upper respiratory infection, unspecified: Secondary | ICD-10-CM | POA: Diagnosis not present

## 2021-06-09 DIAGNOSIS — R059 Cough, unspecified: Secondary | ICD-10-CM | POA: Diagnosis present

## 2021-06-09 DIAGNOSIS — Z7982 Long term (current) use of aspirin: Secondary | ICD-10-CM | POA: Insufficient documentation

## 2021-06-09 DIAGNOSIS — Z79899 Other long term (current) drug therapy: Secondary | ICD-10-CM | POA: Insufficient documentation

## 2021-06-09 LAB — BASIC METABOLIC PANEL
Anion gap: 11 (ref 5–15)
BUN: 23 mg/dL — ABNORMAL HIGH (ref 6–20)
CO2: 29 mmol/L (ref 22–32)
Calcium: 9.8 mg/dL (ref 8.9–10.3)
Chloride: 101 mmol/L (ref 98–111)
Creatinine, Ser: 1.02 mg/dL (ref 0.61–1.24)
GFR, Estimated: >60 mL/min (ref >60)
Glucose, Bld: 103 mg/dL — ABNORMAL HIGH (ref 70–99)
Potassium: 3.9 mmol/L (ref 3.5–5.1)
Sodium: 141 mmol/L (ref 135–145)

## 2021-06-09 LAB — CBC WITH DIFFERENTIAL/PLATELET
Abs Immature Granulocytes: 0.04 10*3/uL (ref 0.00–0.07)
Basophils Absolute: 0.1 10*3/uL (ref 0.0–0.1)
Basophils Relative: 1 %
Eosinophils Absolute: 0.3 10*3/uL (ref 0.0–0.5)
Eosinophils Relative: 4 %
HCT: 46.9 % (ref 39.0–52.0)
Hemoglobin: 15.5 g/dL (ref 13.0–17.0)
Immature Granulocytes: 1 %
Lymphocytes Relative: 19 %
Lymphs Abs: 1.6 10*3/uL (ref 0.7–4.0)
MCH: 28.5 pg (ref 26.0–34.0)
MCHC: 33 g/dL (ref 30.0–36.0)
MCV: 86.4 fL (ref 80.0–100.0)
Monocytes Absolute: 0.6 10*3/uL (ref 0.1–1.0)
Monocytes Relative: 7 %
Neutro Abs: 5.8 10*3/uL (ref 1.7–7.7)
Neutrophils Relative %: 68 %
Platelets: 260 10*3/uL (ref 150–400)
RBC: 5.43 MIL/uL (ref 4.22–5.81)
RDW: 13.1 % (ref 11.5–15.5)
WBC: 8.5 10*3/uL (ref 4.0–10.5)
nRBC: 0 % (ref 0.0–0.2)

## 2021-06-09 LAB — RESP PANEL BY RT-PCR (FLU A&B, COVID) ARPGX2
Influenza A by PCR: NEGATIVE
Influenza B by PCR: NEGATIVE
SARS Coronavirus 2 by RT PCR: NEGATIVE

## 2021-06-09 LAB — D-DIMER, QUANTITATIVE: D-Dimer, Quant: 0.5 ug/mL-FEU (ref 0.00–0.50)

## 2021-06-09 MED ORDER — METHYLPREDNISOLONE 4 MG PO TBPK: ORAL_TABLET | ORAL | 0 refills | Status: DC

## 2021-06-09 MED ORDER — BENZONATATE 100 MG PO CAPS: 100.0000 mg | ORAL_CAPSULE | Freq: Three times a day (TID) | ORAL | 0 refills | Status: DC

## 2021-06-09 MED ORDER — DOXYCYCLINE HYCLATE 100 MG PO CAPS: 100.0000 mg | ORAL_CAPSULE | Freq: Two times a day (BID) | ORAL | 0 refills | Status: AC

## 2021-06-09 NOTE — Discharge Instructions (Signed)
Go to the emergency department as soon as you leave urgent care for further evaluation and management. 

## 2021-06-09 NOTE — ED Provider Notes (Signed)
?[facility] ?Provider Note ? ? ?CSN: [id number] ?Arrival date & time: 06/09/21  1631 ? ?  ? ?History ? ?Chief Complaint  ?Patient presents with  ? Cough  ? ? ?Nathaniel Mosley is a 61 y.o. male. ? ? ?Cough ? ? 61 year old male with a hx of HTN who presents to the ED with a cough for the past three weeks., Was seen at urgent care and was hypoxic and sent to the ED. He endorses coughing up bright green sputum. No hemoptysis. No fevers or chills. No chest pain. Endorses no shortness of breath. ? ?Home Medications ?Prior to Admission medications   ?Medication Sig Start Date End Date Taking? Authorizing Provider  ?benzonatate (TESSALON) 100 MG capsule Take 1 capsule (100 mg total) by mouth every 8 (eight) hours. 06/09/21  Yes Regan Lemming, MD  ?doxycycline (VIBRAMYCIN) 100 MG capsule Take 1 capsule (100 mg total) by mouth 2 (two) times daily for 5 days. 06/09/21 06/14/21 Yes Regan Lemming, MD  ?methylPREDNISolone (MEDROL DOSEPAK) 4 MG TBPK tablet Take as directed on the pak 06/09/21  Yes Regan Lemming, MD  ?amLODipine (NORVASC) 10 MG tablet TAKE 1 TABLET BY MOUTH EVERY DAY 06/23/20   Maximiano Coss, NP  ?aspirin EC 81 MG tablet Take 81 mg by mouth daily.    [provider]  ?Ciclopirox 0.77 % gel APPLY TO AFFECTED AREA TWICE A DAY 01/25/21   Maximiano Coss, NP  ?clotrimazole-betamethasone (LOTRISONE) cream Apply 1 application topically daily. 03/12/21   Ailene Ards, NP  ?ibuprofen (ADVIL,MOTRIN) 600 MG tablet Take 1 tablet (600 mg total) by mouth every 6 (six) hours as needed. 02/05/18   Fatima Blank, MD  ?   ? ?Allergies    ?Azithromycin   ? ?Review of Systems   ?Review of Systems  ?Respiratory:  Positive for cough.   ?All other systems reviewed and are negative. ? ?Physical Exam ?Updated Vital Signs ?BP (!) 142/84   Pulse 74   Temp 98.5 ?F (36.9 ?C) (Oral)   Resp 16   Ht '5\' 6"'$  (1.676 m)   Wt 102.1 kg   SpO2 93%   BMI 36.32 kg/m?  ?Physical Exam ?Vitals and  nursing note reviewed.  ?Constitutional:   ?   General: He is not in acute distress. ?   Appearance: He is well-developed.  ?HENT:  ?   Head: Normocephalic and atraumatic.  ?Eyes:  ?   Conjunctiva/sclera: Conjunctivae normal.  ?Cardiovascular:  ?   Rate and Rhythm: Normal rate and regular rhythm.  ?   Heart sounds: No murmur heard. ?Pulmonary:  ?   Effort: Pulmonary effort is normal. No respiratory distress.  ?   Breath sounds: Normal breath sounds.  ?Abdominal:  ?   Palpations: Abdomen is soft.  ?   Tenderness: There is no abdominal tenderness.  ?Musculoskeletal:     ?   General: No swelling.  ?   Cervical back: Neck supple.  ?Skin: ?   General: Skin is warm and dry.  ?   Capillary Refill: Capillary refill takes less than 2 seconds.  ?Neurological:  ?   Mental Status: He is alert.  ?Psychiatric:     ?   Mood and Affect: Mood normal.  ? ? ?ED Results / Procedures / Treatments   ?Labs ?(all labs ordered are listed, but only abnormal results are displayed) ?Labs Reviewed  ?BASIC METABOLIC PANEL - Abnormal; Notable for the following components:  ?    Result Value  ?  Glucose, Bld 103 (*)   ? BUN 23 (*)   ? All other components within normal limits  ?RESP PANEL BY RT-PCR (FLU A&B, COVID) [id number]  ?CBC WITH DIFFERENTIAL/PLATELET  ?D-DIMER, QUANTITATIVE  ? ? ?EKG ?None ? ?Radiology ?DG Chest 2 View ? ?Result Date: 06/09/2021 ?CLINICAL DATA:  hypoxia, cough for 3 weeks EXAM: CHEST - 2 VIEW COMPARISON:  02/17/2011 chest radiograph. FINDINGS: Stable cardiomediastinal silhouette with normal heart size. No pneumothorax. No pleural effusion. Lungs appear clear, with no acute consolidative airspace disease and no pulmonary edema. IMPRESSION: No active cardiopulmonary disease. Electronically Signed   By: Ilona Sorrel M.D.   On: 06/09/2021 18:18   ? ?Procedures ?Procedures  ? ? ?Medications Ordered in ED ?Medications - No data to display ? ?ED Course/ Medical Decision Making/ A&P ?  ?                        ?Medical Decision  Making ?Amount and/or Complexity of Data Reviewed ?Labs: ordered. ?Radiology: ordered. ? ?Risk ?Prescription drug management. ? ? ?61 year old male with a hx of HTN who presents to the ED with a cough for the past three weeks., Was seen at urgent care and was hypoxic and sent to the ED. He endorses coughing up bright green sputum. No hemoptysis. No fevers or chills. No chest pain. Endorses no shortness of breath. No smoking history. ? ?On arrival, the patient was vitally stable. Physical exam unremarkable. Presenting with three weeks of a cough. Ddx includes bronchitis, CAP, allergic rhinitis, PE, COVID/Flu.  ? ?His O2 saturations were low at urgent care but are ranging normal here in the ED. COVID and influenza testing was negative. D-dimer negative. BMP unremarkable. CBC without a leukocytosis or anemia. Ambulatory without dyspnea, no JVD or LE edema. No chest pain. Doubt ACS or CHF.  ? ?CXR 2 view performed and without evidence of active cardiac or pulmonary abnormality. Symptoms likely consistent with bronchitis. Will treat outpatient and have the patient follow-up with his PCP. ? ?Final Clinical Impression(s) / ED Diagnoses ?Final diagnoses:  ?Bronchitis  ? ? ?Rx / DC Orders ?ED Discharge Orders   ? ?      Ordered  ?  doxycycline (VIBRAMYCIN) 100 MG capsule  2 times daily       ? 06/09/21 1914  ?  methylPREDNISolone (MEDROL DOSEPAK) 4 MG TBPK tablet       ? 06/09/21 1914  ?  benzonatate (TESSALON) 100 MG capsule  Every 8 hours       ? 06/09/21 1914  ? ?  ?  ? ?  ? ? ?  ?Regan Lemming, MD ?06/10/21 1901 ? ?

## 2021-06-09 NOTE — Discharge Instructions (Addendum)
You were evaluated in the Emergency Department and after careful evaluation, we did not find any emergent condition requiring admission or further testing in the hospital. ? ?Your exam/testing today was overall reassuring.  Your chest x-ray imaging revealed no evidence of pneumonia.  The test that checks for blood clots was negative.  You do not have an elevated white blood cell count.  We will treat you with a short course of antibiotics and steroids. ? ?Please return to the Emergency Department if you experience any worsening of your condition.  Thank you for allowing [place] to be a part of your care. ? ?

## 2021-06-09 NOTE — ED Triage Notes (Signed)
Pt at urgent care for coughx 3 weeks, states congested but denies SOB ?Denies fever ?States UC sent him here due to low Spo2  ? ?

## 2021-06-09 NOTE — ED Provider Notes (Signed)
?[facility] ? ? ? ?CSN: [id number] ?Arrival date & time: 06/09/21  1512 ? ? ?  ? ?History   ?Chief Complaint ?Chief Complaint  ?Patient presents with  ? Cough  ? ? ?HPI ?Nathaniel Mosley is a 61 y.o. male.  ? ?Patient presents with nasal congestion, mild productive cough, sore throat that has been present for approximately 3 weeks.  Denies any known fevers or sick contacts.  Denies chest pain, shortness of breath, ear pain, nausea, vomiting, diarrhea, abdominal pain.  Denies history of asthma or COPD.  Patient reports history of pneumonia and states that this "feels similar". ? ? ?Cough ? ?Past Medical History:  ?Diagnosis Date  ? Allergy   ? History of kidney stones   ? Hypertension   ? Kidney stone on left side   ? ? ?Patient Active Problem List  ? Diagnosis Date Noted  ? Obesity 06/06/2016  ? Left lateral epicondylitis 12/09/2015  ? Seasonal allergies 07/07/2011  ? Kidney stone 07/07/2011  ? HTN (hypertension) 04/20/2011  ? ? ?Past Surgical History:  ?Procedure Laterality Date  ? EXTRACORPOREAL SHOCK WAVE LITHOTRIPSY Left 08/13/2018  ? Procedure: EXTRACORPOREAL SHOCK WAVE LITHOTRIPSY (ESWL);  Surgeon: Ardis Hughs, MD;  Location: [facility];  Service: Urology;  Laterality: Left;  ? FRACTURE SURGERY    ? right elbow- 20 + years ago  ? OPEN REPAIR PERIARTICULAR FRACTURE / DISLOCATION ELBOW  1998  ? PLANTAR FASCIA RELEASE Bilateral 2013  ? right cheek  8 years ago  ? 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 TONSILLECTOMY    ? as a child  ? ? ? ? ? ?Home Medications   ? ?Prior to Admission medications   ?Medication Sig Start Date End Date Taking? Authorizing Provider  ?amLODipine (NORVASC) 10 MG tablet TAKE 1 TABLET BY MOUTH EVERY DAY 06/23/20   Maximiano Coss, NP  ?aspirin EC 81 MG tablet Take 81 mg by mouth daily.    [provider]  ?Ciclopirox 0.77 % gel APPLY TO AFFECTED AREA TWICE A DAY 01/25/21   Maximiano Coss, NP  ?clotrimazole-betamethasone (LOTRISONE) cream Apply 1  application topically daily. 03/12/21   Ailene Ards, NP  ?ibuprofen (ADVIL,MOTRIN) 600 MG tablet Take 1 tablet (600 mg total) by mouth every 6 (six) hours as needed. 02/05/18   Fatima Blank, MD  ? ? ?Family History ?Family History  ?Problem Relation Age of Onset  ? Cancer Mother   ? Diabetes Father   ? ? ?Social History ?Social History  ? ?Tobacco Use  ? Smoking status: Never  ? Smokeless tobacco: Never  ?Vaping Use  ? Vaping Use: Never used  ?Substance Use Topics  ? Alcohol use: No  ? Drug use: No  ? ? ? ?Allergies   ?Azithromycin ? ? ?Review of Systems ?Review of Systems ?Per HPI ? ?Physical Exam ?Triage Vital Signs ?ED Triage Vitals  ?Enc Vitals Group  ?   BP --   ?   Pulse Rate 06/09/21 1547 65  ?   Resp 06/09/21 1547 16  ?   Temp 06/09/21 1547 98.2 ?F (36.8 ?C)  ?   Temp Source 06/09/21 1547 Oral  ?   SpO2 06/09/21 1547 90 %  ?   Weight --   ?   Height --   ?   Head Circumference --   ?   Peak Flow --   ?   Pain Score 06/09/21 1546 1  ?   Pain Loc --   ?  Pain Edu? --   ?   Excl. in [place]? --   ? ?No data found. ? ?Updated Vital Signs ?Pulse 65   Temp 98.2 ?F (36.8 ?C) (Oral)   Resp 16   SpO2 90%  ? ?Visual Acuity ?Right Eye Distance:   ?Left Eye Distance:   ?Bilateral Distance:   ? ?Right Eye Near:   ?Left Eye Near:    ?Bilateral Near:    ? ?Physical Exam ?Constitutional:   ?   General: He is not in acute distress. ?   Appearance: Normal appearance. He is not toxic-appearing or diaphoretic.  ?HENT:  ?   Head: Normocephalic and atraumatic.  ?Eyes:  ?   Extraocular Movements: Extraocular movements intact.  ?   Conjunctiva/sclera: Conjunctivae normal.  ?   Pupils: Pupils are equal, round, and reactive to light.  ?Cardiovascular:  ?   Rate and Rhythm: Normal rate and regular rhythm.  ?   Pulses: Normal pulses.  ?   Heart sounds: Normal heart sounds.  ?Pulmonary:  ?   Effort: Pulmonary effort is normal. No respiratory distress.  ?   Breath sounds: No stridor. Rhonchi present. No wheezing or rales.   ?Abdominal:  ?   General: Abdomen is flat. Bowel sounds are normal.  ?   Palpations: Abdomen is soft.  ?Musculoskeletal:     ?   General: Normal range of motion.  ?   Cervical back: Normal range of motion.  ?Skin: ?   General: Skin is warm and dry.  ?Neurological:  ?   General: No focal deficit present.  ?   Mental Status: He is alert and oriented to person, place, and time. Mental status is at baseline.  ?Psychiatric:     ?   Mood and Affect: Mood normal.     ?   Behavior: Behavior normal.  ? ? ? ?UC Treatments / Results  ?Labs ?(all labs ordered are listed, but only abnormal results are displayed) ?Labs Reviewed - No data to display ? ?EKG ? ? ?Radiology ?No results found. ? ?Procedures ?Procedures (including critical care time) ? ?Medications Ordered in UC ?Medications - No data to display ? ?Initial Impression / Assessment and Plan / UC Course  ?I have reviewed the triage vital signs and the nursing notes. ? ?Pertinent labs & imaging results that were available during my care of the patient were reviewed by me and considered in my medical decision making (see chart for details). ? ?  ? ?Called to patient's room by nursing during triage due to oxygen saturation being low.  Oxygen saturation ranging from 87 to 92%.  Patient is not visibly in any distress but was advised that he will need to go to the hospital for further evaluation and management given oxygen saturation.  Suggested EMS transport but patient declined EMS transport.  Risks associated with not going to the hospital via EMS were discussed with patient.  Patient voiced understanding.  He was agreeable to going to the hospital via self transport.  Patient left via self transport to go to the hospital for further evaluation and management. ?Final Clinical Impressions(s) / UC Diagnoses  ? ?Final diagnoses:  ?Low oxygen saturation  ?Acute upper respiratory infection  ?Persistent cough for 3 weeks or longer  ? ? ? ?Discharge Instructions   ? ?  ?Go to the  emergency department as soon as you leave urgent care for further evaluation and management. ? ? ? ?ED Prescriptions   ?None ?  ? ?PDMP  not reviewed this encounter. ?  ?Teodora Medici, [place] ?06/09/21 1622 ? ?

## 2021-06-09 NOTE — ED Triage Notes (Signed)
Pt presents with c/o congestion, productive cough, sore throat x 3 weeks.  ? ?Pt states he has felt better and states he then worsens.  ?

## 2021-06-18 ENCOUNTER — Other Ambulatory Visit (INDEPENDENT_AMBULATORY_CARE_PROVIDER_SITE_OTHER): Payer: 59

## 2021-06-18 DIAGNOSIS — E6609 Other obesity due to excess calories: Secondary | ICD-10-CM | POA: Diagnosis not present

## 2021-06-18 DIAGNOSIS — I10 Essential (primary) hypertension: Secondary | ICD-10-CM

## 2021-06-18 DIAGNOSIS — Z1329 Encounter for screening for other suspected endocrine disorder: Secondary | ICD-10-CM | POA: Diagnosis not present

## 2021-06-18 DIAGNOSIS — B356 Tinea cruris: Secondary | ICD-10-CM | POA: Diagnosis not present

## 2021-06-18 DIAGNOSIS — Z125 Encounter for screening for malignant neoplasm of prostate: Secondary | ICD-10-CM

## 2021-06-18 DIAGNOSIS — R7303 Prediabetes: Secondary | ICD-10-CM | POA: Diagnosis not present

## 2021-06-18 DIAGNOSIS — Z6836 Body mass index (BMI) 36.0-36.9, adult: Secondary | ICD-10-CM

## 2021-06-18 LAB — LIPID PANEL
Cholesterol: 159 mg/dL (ref 0–200)
HDL: 35.1 mg/dL — ABNORMAL LOW (ref >39.00)
LDL Cholesterol: 85 mg/dL (ref 0–99)
NonHDL: 124.28
Total CHOL/HDL Ratio: 5
Triglycerides: 197 mg/dL — ABNORMAL HIGH (ref 0.0–149.0)
VLDL: 39.4 mg/dL (ref 0.0–40.0)

## 2021-06-18 LAB — COMPREHENSIVE METABOLIC PANEL
ALT: 14 U/L (ref 0–53)
AST: 15 U/L (ref 0–37)
Albumin: 4.2 g/dL (ref 3.5–5.2)
Alkaline Phosphatase: 64 U/L (ref 39–117)
BUN: 15 mg/dL (ref 6–23)
CO2: 30 mEq/L (ref 19–32)
Calcium: 8.8 mg/dL (ref 8.4–10.5)
Chloride: 103 mEq/L (ref 96–112)
Creatinine, Ser: 0.9 mg/dL (ref 0.40–1.50)
GFR: 93.36 mL/min (ref >60.00)
Glucose, Bld: 103 mg/dL — ABNORMAL HIGH (ref 70–99)
Potassium: 4 mEq/L (ref 3.5–5.1)
Sodium: 142 mEq/L (ref 135–145)
Total Bilirubin: 0.6 mg/dL (ref 0.2–1.2)
Total Protein: 6.8 g/dL (ref 6.0–8.3)

## 2021-06-18 LAB — CBC WITH DIFFERENTIAL/PLATELET
Basophils Absolute: 0 10*3/uL (ref 0.0–0.1)
Basophils Relative: 0.5 % (ref 0.0–3.0)
Eosinophils Absolute: 0.2 10*3/uL (ref 0.0–0.7)
Eosinophils Relative: 3 % (ref 0.0–5.0)
HCT: 46.9 % (ref 39.0–52.0)
Hemoglobin: 15.9 g/dL (ref 13.0–17.0)
Lymphocytes Relative: 32.2 % (ref 12.0–46.0)
Lymphs Abs: 1.8 10*3/uL (ref 0.7–4.0)
MCHC: 34 g/dL (ref 30.0–36.0)
MCV: 87.2 fl (ref 78.0–100.0)
Monocytes Absolute: 0.3 10*3/uL (ref 0.1–1.0)
Monocytes Relative: 6.1 % (ref 3.0–12.0)
Neutro Abs: 3.2 10*3/uL (ref 1.4–7.7)
Neutrophils Relative %: 58.2 % (ref 43.0–77.0)
Platelets: 220 10*3/uL (ref 150.0–400.0)
RBC: 5.38 Mil/uL (ref 4.22–5.81)
RDW: 14 % (ref 11.5–15.5)
WBC: 5.5 10*3/uL (ref 4.0–10.5)

## 2021-06-18 LAB — HEMOGLOBIN A1C: Hgb A1c MFr Bld: 6.5 % (ref 4.6–6.5)

## 2021-06-18 LAB — TSH: TSH: 2.63 u[IU]/mL (ref 0.35–5.50)

## 2021-06-18 LAB — PSA: PSA: 0.84 ng/mL (ref 0.10–4.00)

## 2021-06-24 ENCOUNTER — Ambulatory Visit (INDEPENDENT_AMBULATORY_CARE_PROVIDER_SITE_OTHER): Payer: 59 | Admitting: Nurse Practitioner

## 2021-06-24 VITALS — BP 128/76 | HR 69 | Temp 98.4°F | Ht 66.0 in | Wt 223.0 lb

## 2021-06-24 DIAGNOSIS — I10 Essential (primary) hypertension: Secondary | ICD-10-CM | POA: Diagnosis not present

## 2021-06-24 DIAGNOSIS — Z0001 Encounter for general adult medical examination with abnormal findings: Secondary | ICD-10-CM | POA: Diagnosis not present

## 2021-06-24 DIAGNOSIS — E119 Type 2 diabetes mellitus without complications: Secondary | ICD-10-CM | POA: Diagnosis not present

## 2021-06-24 DIAGNOSIS — B079 Viral wart, unspecified: Secondary | ICD-10-CM

## 2021-06-24 DIAGNOSIS — B356 Tinea cruris: Secondary | ICD-10-CM

## 2021-06-24 DIAGNOSIS — K429 Umbilical hernia without obstruction or gangrene: Secondary | ICD-10-CM

## 2021-06-24 DIAGNOSIS — E781 Pure hyperglyceridemia: Secondary | ICD-10-CM

## 2021-06-24 MED ORDER — CLOTRIMAZOLE-BETAMETHASONE 1-0.05 % EX CREA: 1.0000 "application " | TOPICAL_CREAM | Freq: Every day | CUTANEOUS | 0 refills | Status: DC

## 2021-06-24 MED ORDER — AMLODIPINE BESYLATE 10 MG PO TABS: 10.0000 mg | ORAL_TABLET | Freq: Every day | ORAL | 3 refills | Status: DC

## 2021-06-24 NOTE — Assessment & Plan Note (Signed)
Currently resolved, will prescribe refill of Lotrisone cream that he can use as needed. ?

## 2021-06-24 NOTE — Progress Notes (Signed)
? ? ? ?Subjective:  ?Patient ID: Nathaniel Mosley, male    DOB: 09/10/1960  Age: 61 y.o. MRN: [id number] ? ?CC:  ?Chief Complaint  ?Patient presents with  ? Physical  ? Blister like knot  ?  On left pointed finger  ?  ? ? ?HPI  ?This patient arrives today for the above. ? ?Annual physical: Overall is feeling well.  Patient came in approximately 1 week ago in a fasting state and had blood work drawn.  He is here to discuss those results as well.  He also has a new lesion on the second digit of his left hand that he noticed approximate 1 month ago.  He reports that there is no pain currently, however it was painful when he first noticed it.  Size has been stable for the last month, no drainage noted.  Blood work at last office visit showed elevated triglycerides at 197, A1c of 6.5 (previously A1c has been in the prediabetic range, he was also recently treated with steroids for bronchitis.  He also reports recently working very long hours and not making the best choices regarding food intake), normal CMP (except for glucose of 103), normal CBC, normal TSH, and PSA of 0.84 (previous PSA from 2014 was 0.83).  ? ? ?Hypertriglyceridemia: Patient is not on any medication currently for treatment of elevated cholesterol or triglycerides.  HDL is 35, total cholesterol is 159, LDL was 85.  Patient denies any chest pain. ? ?Type 2 diabetes: This is a new diagnosis, previous A1c's have been in the prediabetic range.  As stated above patient was recently treated with steroids as well as making poor dietary choices (probably related to long hours being at work, tells me his schedule is going to slow down).  ? ?Umbilical hernia: He continues to have an umbilical hernia, he has been evaluated by general surgery and plans on getting this repaired. ? ?Hypertension: Patient continues on amlodipine and is tolerating this well.  He is requesting refill today. ? ?Tinea cruris: Patient use Lotrisone cream which resolved his rash.   Tells me rashes come back periodically and would like refill of Lotrisone cream to use if needed in the future. ? ?Past Medical History:  ?Diagnosis Date  ? Allergy   ? History of kidney stones   ? Hypertension   ? Kidney stone on left side   ? ? ? ? ?Family History  ?Problem Relation Age of Onset  ? Cancer Mother   ? Diabetes Father   ? ? ?Social History  ? ?Social History Narrative  ? Not on file  ? ?Social History  ? ?Tobacco Use  ? Smoking status: Never  ? Smokeless tobacco: Never  ?Substance Use Topics  ? Alcohol use: No  ? ? ? ?Current Meds  ?Medication Sig  ? aspirin EC 81 MG tablet Take 81 mg by mouth daily.  ? Ciclopirox 0.77 % gel APPLY TO AFFECTED AREA TWICE A DAY  ? ibuprofen (ADVIL,MOTRIN) 600 MG tablet Take 1 tablet (600 mg total) by mouth every 6 (six) hours as needed.  ? [DISCONTINUED] amLODipine (NORVASC) 10 MG tablet TAKE 1 TABLET BY MOUTH EVERY DAY  ? [DISCONTINUED] benzonatate (TESSALON) 100 MG capsule Take 1 capsule (100 mg total) by mouth every 8 (eight) hours.  ? [DISCONTINUED] clotrimazole-betamethasone (LOTRISONE) cream Apply 1 application topically daily.  ? [DISCONTINUED] methylPREDNISolone (MEDROL DOSEPAK) 4 MG TBPK tablet Take as directed on the pak  ? ? ?ROS:  ?Review of Systems  ?Eyes:  Negative for blurred vision and double vision.  ?Respiratory:  Negative for shortness of breath.   ?Cardiovascular:  Negative for chest pain.  ?Gastrointestinal:  Negative for abdominal pain.  ?Neurological:  Negative for dizziness and headaches.  ? ? ?Objective:  ? ?Today's Vitals: BP 128/76 (BP Location: Right Arm, Patient Position: Sitting, Cuff Size: Large)   Pulse 69   Temp 98.4 ?F (36.9 ?C) (Oral)   Ht '5\' 6"'$  (1.676 m)   Wt 223 lb (101.2 kg)   SpO2 93%   BMI 35.99 kg/m?  ? ?  06/24/2021  ?  8:00 AM 06/09/2021  ?  7:17 PM 06/09/2021  ?  6:30 PM  ?Vitals with BMI  ?Height '5\' 6"'$     ?Weight 223 lbs    ?BMI 36.01    ?Systolic 818 299 371  ?Diastolic 76 84 94  ?Pulse 69 74 58  ?  ? ?Physical  Exam ?Vitals reviewed.  ?Constitutional:   ?   General: He is not in acute distress. ?   Appearance: Normal appearance. He is not ill-appearing.  ?HENT:  ?   Head: Normocephalic and atraumatic.  ?   Right Ear: Tympanic membrane, ear canal and external ear normal.  ?   Left Ear: Tympanic membrane, ear canal and external ear normal.  ?Eyes:  ?   General: No scleral icterus. ?   Extraocular Movements: Extraocular movements intact.  ?   Conjunctiva/sclera: Conjunctivae normal.  ?   Pupils: Pupils are equal, round, and reactive to light.  ?Neck:  ?   Vascular: No carotid bruit.  ?Cardiovascular:  ?   Rate and Rhythm: Normal rate and regular rhythm.  ?   Pulses: Normal pulses.  ?   Heart sounds: Normal heart sounds.  ?Pulmonary:  ?   Effort: Pulmonary effort is normal.  ?   Breath sounds: Normal breath sounds.  ?Abdominal:  ?   General: Bowel sounds are normal. There is no distension.  ?   Palpations: There is no mass.  ?   Tenderness: There is no abdominal tenderness.  ?   Hernia: A hernia is present. Hernia is present in the umbilical area.  ?Musculoskeletal:     ?   General: No swelling or tenderness.  ?   Cervical back: Normal range of motion and neck supple. No rigidity.  ?Lymphadenopathy:  ?   Cervical: No cervical adenopathy.  ?Skin: ?   General: Skin is warm and dry.  ? ?    ?Neurological:  ?   General: No focal deficit present.  ?   Mental Status: He is alert and oriented to person, place, and time.  ?   Cranial Nerves: No cranial nerve deficit.  ?   Sensory: No sensory deficit.  ?   Motor: No weakness.  ?   Gait: Gait normal.  ?Psychiatric:     ?   Mood and Affect: Mood normal.     ?   Behavior: Behavior normal.     ?   Judgment: Judgment normal.  ? ? ? ? ? ? ? ?Assessment and Plan  ? ?1. Encounter for general adult medical examination with abnormal findings   ?2. Essential hypertension   ?3. Tinea cruris   ?4. Type 2 diabetes mellitus without complication, without long-term current use of insulin ([place])   ?5.  Viral wart on finger   ?6. Hypertriglyceridemia   ?7. Umbilical hernia without obstruction and without gangrene   ? ? ? ?Plan: ?See plan via problem list  below. ? ? ?Tests ordered ?Orders Placed This Encounter  ?Procedures  ? HgB A1c  ? ? ? ? ?Meds ordered this encounter  ?Medications  ? amLODipine (NORVASC) 10 MG tablet  ?  Sig: Take 1 tablet (10 mg total) by mouth daily.  ?  Dispense:  90 tablet  ?  Refill:  3  ? clotrimazole-betamethasone (LOTRISONE) cream  ?  Sig: Apply 1 application. topically daily.  ?  Dispense:  30 g  ?  Refill:  0  ?  Order Specific Question:   Supervising Provider  ?  Answer:   Binnie Rail [[id number]]  ? ? ?Patient to follow-up in 3 months to have A1C repeated, per shared decision making next office visit will be planned for 6 months.  However, patient is aware and agreeable to coming in sooner pending A1c results. ? ?In addition to performing patient's annual exam today I also performed an office visit as discussed above. ? ?Ailene Ards, NP ? ?

## 2021-06-24 NOTE — Assessment & Plan Note (Signed)
Lesion on his left hand appears consistent with wart.  Recommend he use over-the-counter treatment, patient will be seeing dermatology in a few months regarding lesion on his back.  He was encouraged to discuss this further with dermatology if wart does not resolve with over-the-counter treatment by then.  He reports his understanding. ?

## 2021-06-24 NOTE — Assessment & Plan Note (Signed)
Chronic, stable.  Amlodipine 10 mg by mouth daily refilled and sent to patient's pharmacy. ?

## 2021-06-24 NOTE — Assessment & Plan Note (Addendum)
New diagnosis, currently at goal with A1c at 6.5.  He is not on pharmacological therapy at this time.  He is motivated to make lifestyle changes aimed at reducing A1c further.  He would like to hold off on medication at this time.  I am agreeable to this.  We did discuss long-term secondary effects of type 2 diabetes such as increased risk for cardiovascular disease and kidney disease.  We did discuss often recommendation will be to start medication to prevent these diseases, for now patient would like to focus on lifestyle changes.  Plan to recheck A1c in 3 months, and pending this result may consider pharmacological therapy.  A1c ordered today, patient reports he is willing to come in on his own without needing appointment. ?

## 2021-06-24 NOTE — Assessment & Plan Note (Signed)
Chronic, stable.  Patient to follow-up with general surgery as scheduled. ?

## 2021-06-24 NOTE — Assessment & Plan Note (Signed)
Chronic, fairly mild.  Patient will focus on lifestyle adjustments as opposed to starting pharmacological therapy at this time. ?

## 2021-06-24 NOTE — Assessment & Plan Note (Signed)
Overall exam looked benign.  Blood work results discussed.  He is up-to-date with colon cancer screening and prostate cancer screening.  He would be due for COVID-19 vaccine and shingles vaccine but he is elected not to undergo these at this time.  Patient to follow-up for repeat annual exam next year. ?

## 2021-08-11 ENCOUNTER — Other Ambulatory Visit: Payer: Self-pay | Admitting: Registered Nurse

## 2021-08-11 DIAGNOSIS — B356 Tinea cruris: Secondary | ICD-10-CM

## 2021-08-16 ENCOUNTER — Other Ambulatory Visit: Payer: Self-pay | Admitting: Nurse Practitioner

## 2021-08-16 DIAGNOSIS — B356 Tinea cruris: Secondary | ICD-10-CM

## 2021-10-06 ENCOUNTER — Ambulatory Visit: Payer: 59 | Admitting: Physician Assistant

## 2021-11-02 ENCOUNTER — Encounter: Payer: Self-pay | Admitting: Nurse Practitioner

## 2021-11-03 ENCOUNTER — Encounter: Payer: Self-pay | Admitting: Family

## 2021-11-03 ENCOUNTER — Telehealth: Payer: 59 | Admitting: Family

## 2021-11-03 DIAGNOSIS — U071 COVID-19: Secondary | ICD-10-CM | POA: Diagnosis not present

## 2021-11-03 MED ORDER — MOLNUPIRAVIR EUA 200MG CAPSULE
4.0000 | ORAL_CAPSULE | Freq: Two times a day (BID) | ORAL | 0 refills | Status: AC
Start: 2021-11-03 — End: 2021-11-08

## 2021-11-03 NOTE — Progress Notes (Signed)
MyChart Video Visit    Virtual Visit via Video Note   This format is felt to be most appropriate for this patient at this time. Physical exam was limited by quality of the video and audio technology used for the visit. CMA was able to get the patient set up on a video visit.  Patient location: Home. Patient and provider in visit Provider location: Office  I discussed the limitations of evaluation and management by telemedicine and the availability of in person appointments. The patient expressed understanding and agreed to proceed.  Visit Date: 11/03/2021  Today's healthcare provider: Jeanie Sewer, NP     Subjective:   Patient ID: Nathaniel Mosley, male    DOB: December 18, 1960, 61 y.o.   MRN: [id number]  Chief Complaint  Patient presents with   Covid Positive    Pt tested positive for covid lastnight/    HPI Upper Respiratory Infection: Symptoms include achiness, congestion, non productive cough, and sore throat.  Onset of symptoms was 1 day ago, gradually worsening since that time. He is drinking moderate amounts of fluids. Evaluation to date: none. Treatment to date: none. Reports he had Covid back in Jan of this year and took Molnupiravir with good results.   Assessment & Plan:   Problem List Items Addressed This Visit   None Visit Diagnoses     COVID-19    -  Primary Sending Molnupiravir, pt advised of FDA label for emergency use, how to take, & SE. Advised of CDC guidelines for masking if out in public. OK to continue taking OTC sinus or pain meds. Encouraged to monitor & notify office of any worsening symptoms: increased shortness of breath, weakness, and signs of dehydration. Instructed to rest and hydrate well.     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 90 tablet 3   aspirin EC 81 MG tablet Take 81 mg by mouth daily.     Ciclopirox 0.77 % gel APPLY TO AFFECTED AREA TWICE A DAY 45 g 1   clotrimazole-betamethasone (LOTRISONE) cream Apply 1 application. topically daily. 30 g 0   ibuprofen (ADVIL,MOTRIN) 600 MG tablet Take 1 tablet (600 mg total) by mouth every 6 (six) hours as needed. 30 tablet 0   No facility-administered medications prior to visit.    Allergies  Allergen Reactions   Azithromycin Other (See Comments)    GI upset       Objective:   Physical Exam Vitals and nursing note reviewed.  Constitutional:      General:  She is not in acute distress.    Appearance: Normal appearance.  HENT:     Head: Normocephalic.  Pulmonary:     Effort: No respiratory distress.  Musculoskeletal:     Cervical back: Normal range of motion.  Skin:    General: Skin is dry.     Coloration: Skin is not pale.  Neurological:     Mental Status: She is alert and oriented to person, place, and time.  Psychiatric:        Mood and Affect: Mood normal.   There were no vitals taken for this visit.  Wt Readings from Last 3 Encounters:  06/24/21 223 lb (101.2 kg)  06/09/21 225 lb (102.1 kg)  03/12/21 226 lb (102.5 kg)       I discussed the assessment and treatment plan with the patient. The patient was  provided an opportunity to ask questions and all were answered. The patient agreed with the plan and demonstrated an understanding of the instructions.   The patient was advised to call back or seek an in-person evaluation if the symptoms worsen or if the condition fails to improve as anticipated.  Jeanie Sewer, NP [place] [phone] (phone) [phone] (fax)  [place]

## 2021-12-16 ENCOUNTER — Encounter: Payer: Self-pay | Admitting: Nurse Practitioner

## 2021-12-20 ENCOUNTER — Other Ambulatory Visit (INDEPENDENT_AMBULATORY_CARE_PROVIDER_SITE_OTHER): Payer: 59

## 2021-12-20 DIAGNOSIS — E119 Type 2 diabetes mellitus without complications: Secondary | ICD-10-CM

## 2021-12-20 LAB — HEMOGLOBIN A1C: Hgb A1c MFr Bld: 6.5 % (ref 4.6–6.5)

## 2021-12-24 ENCOUNTER — Ambulatory Visit: Payer: 59 | Admitting: Nurse Practitioner

## 2021-12-24 VITALS — BP 148/76 | HR 73 | Temp 97.8°F | Ht 66.0 in | Wt 231.0 lb

## 2021-12-24 DIAGNOSIS — N201 Calculus of ureter: Secondary | ICD-10-CM | POA: Insufficient documentation

## 2021-12-24 DIAGNOSIS — I10 Essential (primary) hypertension: Secondary | ICD-10-CM

## 2021-12-24 DIAGNOSIS — B356 Tinea cruris: Secondary | ICD-10-CM | POA: Diagnosis not present

## 2021-12-24 DIAGNOSIS — E119 Type 2 diabetes mellitus without complications: Secondary | ICD-10-CM

## 2021-12-24 DIAGNOSIS — H9313 Tinnitus, bilateral: Secondary | ICD-10-CM

## 2021-12-24 DIAGNOSIS — Z860100 Personal history of colon polyps, unspecified: Secondary | ICD-10-CM | POA: Insufficient documentation

## 2021-12-24 DIAGNOSIS — K5900 Constipation, unspecified: Secondary | ICD-10-CM | POA: Insufficient documentation

## 2021-12-24 MED ORDER — CICLOPIROX 0.77 % EX GEL: CUTANEOUS | 1 refills | Status: DC

## 2021-12-24 MED ORDER — AMLODIPINE BESYLATE 10 MG PO TABS: 10.0000 mg | ORAL_TABLET | Freq: Every day | ORAL | 3 refills | Status: DC

## 2021-12-24 NOTE — Assessment & Plan Note (Signed)
Chronic, wonder if he may have had a cough from ACE inhibitor in the past.  Blood pressure above goal today.  Patient to continue amlodipine 10 mg mouth daily.  Per shared decision making patient will monitor blood pressure at home and will let me know if systolic blood pressure is consistently greater than 130 over the next 2 to 3 weeks.  Plan will be to initiate low-dose losartan if blood pressure above goal, patient was educated on risk for cough returning.  If this does occur he was told to reach out to me.

## 2021-12-24 NOTE — Assessment & Plan Note (Signed)
Show decision-making conversation regarding starting medication to lower blood sugar, patient would prefer not to initiate medication if possible, A1c is technically at goal.  Patient will continue focusing on lifestyle modification aimed at maintaining A1c less than 7.  Referral to ophthalmology for diabetic eye exam ordered today.  We will recommend against ACE inhibitor but may start ARB pending patient's at home blood pressure readings.  May need to consider statin therapy at subsequent visit.

## 2021-12-24 NOTE — Assessment & Plan Note (Signed)
Etiology unclear, did have discussion regarding discontinuing aspirin to see if this improves tinnitus.  We did discuss that aspirin is being taken for prevention of heart attack stroke, and if he stops aspirin risk of heart attack or stroke may increase.  Patient reports understanding.  No red flags noted requiring evaluation by ENT at this time, however may refer if symptoms persist or worsen.

## 2021-12-24 NOTE — Progress Notes (Signed)
Established Patient Office Visit  Subjective   Patient ID: Nathaniel Mosley, male    DOB: February 09, 1961  Age: 61 y.o. MRN: [id number]  Chief Complaint  Patient presents with   Diabetes    Type 2 diabetes/hypertension/hyperlipidemia: Type 2 diabetes relatively new diagnosis.  A1c just checked 6.5.  Not currently on medication to control blood sugars.  Not on ACE or ARB or statin therapy.  Currently on amlodipine 10 mg daily for hypertension.  Reports history of cough with previous antihypertensive agent.  Last LDL 85, ASCVD risk score approximately 22.8%.  Patient is on daily low-dose aspirin.  Tinnitus: Started earlier this year, it is constant.  Got worse after COVID-19 infection in September 2023.  Denies pulsatile characteristic, denies vertigo, denies visual changes.  He is able to sleep.    Review of Systems  Constitutional:  Negative for fever.  HENT:  Positive for tinnitus.   Respiratory:  Negative for shortness of breath.   Cardiovascular:  Negative for chest pain.  Neurological:  Negative for dizziness.      Objective:     BP (!) 148/76   Pulse 73   Temp 97.8 F (36.6 C) (Oral)   Ht '5\' 6"'$  (1.676 m)   Wt 231 lb (104.8 kg)   SpO2 95%   BMI 37.28 kg/m  BP Readings from Last 3 Encounters:  12/24/21 (!) 148/76  06/24/21 128/76  06/09/21 (!) 142/84   Wt Readings from Last 3 Encounters:  12/24/21 231 lb (104.8 kg)  06/24/21 223 lb (101.2 kg)  06/09/21 225 lb (102.1 kg)      Physical Exam Vitals reviewed.  Constitutional:      Appearance: Normal appearance.  HENT:     Head: Normocephalic and atraumatic.  Cardiovascular:     Rate and Rhythm: Normal rate and regular rhythm.  Pulmonary:     Effort: Pulmonary effort is normal.     Breath sounds: Normal breath sounds.  Musculoskeletal:     Cervical back: Neck supple.  Skin:    General: Skin is warm and dry.  Neurological:     Mental Status: He is alert and oriented to person, place, and time.   Psychiatric:        Mood and Affect: Mood normal.        Behavior: Behavior normal.        Thought Content: Thought content normal.        Judgment: Judgment normal.      No results found for any visits on 12/24/21.    The 10-year ASCVD risk score (Arnett DK, et al., 2019) is: 24.3%    Assessment & Plan:   Problem List Items Addressed This Visit       Cardiovascular and Mediastinum   Essential hypertension    Chronic, wonder if he may have had a cough from ACE inhibitor in the past.  Blood pressure above goal today.  Patient to continue amlodipine 10 mg mouth daily.  Per shared decision making patient will monitor blood pressure at home and will let me know if systolic blood pressure is consistently greater than 130 over the next 2 to 3 weeks.  Plan will be to initiate low-dose losartan if blood pressure above goal, patient was educated on risk for cough returning.  If this does occur he was told to reach out to me.      Relevant Medications   amLODipine (NORVASC) 10 MG tablet     Endocrine   Type 2 diabetes mellitus  without complication, without long-term current use of insulin ([place]) - Primary    Show decision-making conversation regarding starting medication to lower blood sugar, patient would prefer not to initiate medication if possible, A1c is technically at goal.  Patient will continue focusing on lifestyle modification aimed at maintaining A1c less than 7.  Referral to ophthalmology for diabetic eye exam ordered today.  We will recommend against ACE inhibitor but may start ARB pending patient's at home blood pressure readings.  May need to consider statin therapy at subsequent visit.      Relevant Orders   Ambulatory referral to Ophthalmology     Musculoskeletal and Integument   Tinea cruris    Patient requesting refill on ciclopirox 0.77% gel.  Refill sent to patient's pharmacy.      Relevant Medications   Ciclopirox 0.77 % gel     Other   Tinnitus of both ears     Etiology unclear, did have discussion regarding discontinuing aspirin to see if this improves tinnitus.  We did discuss that aspirin is being taken for prevention of heart attack stroke, and if he stops aspirin risk of heart attack or stroke may increase.  Patient reports understanding.  No red flags noted requiring evaluation by ENT at this time, however may refer if symptoms persist or worsen.       Return in about 6 months (around 06/25/2022) for CPE for Tahjay Binion.    Ailene Ards, NP

## 2021-12-24 NOTE — Patient Instructions (Addendum)
Check BP at home if BP is consistently >130 then please reach out

## 2021-12-24 NOTE — Assessment & Plan Note (Signed)
Patient requesting refill on ciclopirox 0.77% gel.  Refill sent to patient's pharmacy.

## 2022-04-09 ENCOUNTER — Encounter (HOSPITAL_COMMUNITY): Payer: Self-pay

## 2022-04-09 ENCOUNTER — Emergency Department (HOSPITAL_COMMUNITY)
Admission: EM | Admit: 2022-04-09 | Discharge: 2022-04-09 | Disposition: A | Discharge status: Home / Self Care | Payer: 59 | Attending: Emergency Medicine | Admitting: Emergency Medicine

## 2022-04-09 ENCOUNTER — Other Ambulatory Visit: Payer: Self-pay

## 2022-04-09 DIAGNOSIS — R040 Epistaxis: Secondary | ICD-10-CM | POA: Insufficient documentation

## 2022-04-09 MED ORDER — TRANEXAMIC ACID FOR EPISTAXIS
500.0000 mg | Freq: Once | TOPICAL | Status: AC
  Administered 2022-04-09: 500 mg via TOPICAL
  Filled 2022-04-09: qty 10

## 2022-04-09 MED ORDER — LIDOCAINE-EPINEPHRINE 1 %-1:100000 IJ SOLN
10.0000 mL | Freq: Once | INTRAMUSCULAR | Status: AC
  Administered 2022-04-09: 10 mL via INTRADERMAL
  Filled 2022-04-09: qty 1

## 2022-04-09 MED ORDER — OXYMETAZOLINE HCL 0.05 % NA SOLN
1.0000 | Freq: Once | NASAL | Status: AC
  Administered 2022-04-09: 1 via NASAL
  Filled 2022-04-09: qty 30

## 2022-04-09 MED ORDER — SILVER NITRATE-POT NITRATE 75-25 % EX MISC
1.0000 | Freq: Once | CUTANEOUS | Status: AC
  Administered 2022-04-09: 1 via TOPICAL
  Filled 2022-04-09: qty 1

## 2022-04-09 NOTE — Discharge Instructions (Addendum)
The "Rapid Rhino" packing in your left nose should be removed in 3 days.  This can be done at your doctor's office, an urgent care, or the emergency department.  After the packing is removed, consider keeping your nose moisturized with smearing a small amount of Vaseline on the inside of each nostril with the end of your pinky twice a day when the weather is dry.  Consider buying a humidifier to keep in the room at night where you sleep.  Do not pick at your nose.  You can schedule follow-up appointment with an ear nose and throat doctor if you continue to experience recurring nosebleeds.  If your nose starts bleeding again please apply firm pressure to the soft part of your nose in the front as I showed you for 10 minutes without letting go.  Then gently blow out the clots, squirt 1 spray of Afrin in each nostril, and apply pressure again.  If this does not stop your bleeding, if you begin to feel lightheaded or dizzy, please call 911 and return to the ER.

## 2022-04-09 NOTE — ED Provider Notes (Signed)
[facility] Provider Note   CSN: [id number] Arrival date & time: 04/09/22  [id number]     History  Chief Complaint  Patient presents with   Epistaxis    Nathaniel Mosley is a 62 y.o. male.here with nose bleed, left sided, on and off since Wed.  Reports he had a cold this week, blowing his nose a lot.  Bleeding started again last night, not stopping with pressure.  He is not on A/C.  HPI     Home Medications Prior to Admission medications   Medication Sig Start Date End Date Taking? Authorizing Provider  amLODipine (NORVASC) 10 MG tablet Take 1 tablet (10 mg total) by mouth daily. 12/24/21   Ailene Ards, NP  Ciclopirox 0.77 % gel APPLY TO AFFECTED AREA TWICE A DAY 12/24/21   Ailene Ards, NP  clotrimazole-betamethasone (LOTRISONE) cream Apply 1 application. topically daily. 06/24/21   Ailene Ards, NP  ibuprofen (ADVIL,MOTRIN) 600 MG tablet Take 1 tablet (600 mg total) by mouth every 6 (six) hours as needed. 02/05/18   CardamaGrayce Sessions, MD      Allergies    Ace inhibitors and Azithromycin    Review of Systems   Review of Systems  Physical Exam Updated Vital Signs BP (!) 135/101   Pulse 72   Temp 98 F (36.7 C)   Resp 15   Ht 5' 6"$  (1.676 m)   Wt 104.8 kg   SpO2 91%   BMI 37.28 kg/m  Physical Exam Constitutional:      General: He is not in acute distress. HENT:     Head: Normocephalic and atraumatic.     Comments: Bleeding from left nares Eyes:     Conjunctiva/sclera: Conjunctivae normal.     Pupils: Pupils are equal, round, and reactive to light.  Cardiovascular:     Rate and Rhythm: Normal rate and regular rhythm.  Pulmonary:     Effort: Pulmonary effort is normal. No respiratory distress.  Abdominal:     General: There is no distension.     Tenderness: There is no abdominal tenderness.  Skin:    General: Skin is warm and dry.  Neurological:     General: No focal deficit present.     Mental  Status: He is alert. Mental status is at baseline.  Psychiatric:        Mood and Affect: Mood normal.        Behavior: Behavior normal.     ED Results / Procedures / Treatments   Labs (all labs ordered are listed, but only abnormal results are displayed) Labs Reviewed - No data to display  EKG None  Radiology No results found.  Procedures .Epistaxis Management  Date/Time: 04/09/2022 10:39 AM  Performed by: Wyvonnia Dusky, MD Authorized by: Wyvonnia Dusky, MD   Consent:    Consent obtained:  Verbal   Risks, benefits, and alternatives were discussed: yes     Risks discussed:  Infection, pain, nasal injury and bleeding   Alternatives discussed:  Observation Universal protocol:    Procedure explained and questions answered to patient or proxy's satisfaction: yes     Site/side marked: yes     Immediately prior to procedure, a time out was called: yes     Patient identity confirmed:  Arm band Anesthesia:    Anesthesia method:  Topical application   Topical anesthetic:  Epinephrine and lidocaine gel Procedure details:    Treatment site:  L  anterior   Treatment method:  Anterior pack   Treatment complexity:  Limited   Treatment episode: recurring   Post-procedure details:    Assessment:  Bleeding decreased   Procedure completion:  Tolerated well, no immediate complications     Medications Ordered in ED Medications  oxymetazoline (AFRIN) 0.05 % nasal spray 1 spray (1 spray Each Nare Given 04/09/22 0832)  silver nitrate applicators applicator 1 Application (1 Application Topical Given by Other 04/09/22 1001)  tranexamic acid (CYKLOKAPRON) 1000 MG/10ML topical solution 500 mg (500 mg Topical Given by Other 04/09/22 1002)  lidocaine-EPINEPHrine (XYLOCAINE W/EPI) 1 %-1:100000 (with pres) injection 10 mL (10 mLs Intradermal Given by Other 04/09/22 1002)    ED Course/ Medical Decision Making/ A&P Clinical Course as of 04/09/22 1653  Sat Apr 09, 2022  1038 Left nares  packed with rapid rhino 7 cm after failure to control bleeding with pressure and pledglet with txa/lido with epi [MT]  1249 Patient reassessed and had no further bleeding.  No bleeding visualized in the posterior oropharynx.  I think is stable for discharge at this time with his packing in place.  He verbalized understanding [MT]    Clinical Course User Index [MT] Zaydenn Balaguer, Carola Rhine, MD                             Medical Decision Making Risk OTC drugs. Prescription drug management.   Nose bleed, suspected anterior, likely 2/2 dry nose/cold this week Advised pressure on nose, afrin constriction  Attempted topical Txa and lidocaine w/ epi with pledglet but still bleeding  Subsequently nasal packing placed with rapid rhino, about 9 cc's air inflated, and bleeding has stopped after 2 hour observation        Final Clinical Impression(s) / ED Diagnoses Final diagnoses:  Epistaxis    Rx / DC Orders ED Discharge Orders     None         Wyvonnia Dusky, MD 04/09/22 [phone]

## 2022-04-09 NOTE — ED Triage Notes (Signed)
Reports nosebleed that started around midnight.  Reports he can feel there is a lot of blood in his nose bc it is running down the back of his throat.  Reports head cold earlier in the week.

## 2022-04-11 ENCOUNTER — Telehealth: Payer: Self-pay

## 2022-04-11 ENCOUNTER — Ambulatory Visit: Payer: 59 | Admitting: Internal Medicine

## 2022-04-11 NOTE — Transitions of Care (Post Inpatient/ED Visit) (Signed)
   04/11/2022  Name: Nathaniel Mosley MRN: [id number] DOB: [birth year]  Today's TOC FU Call Status: Today's TOC FU Call Status:: Successful TOC FU Call Competed TOC FU Call Complete Date: 04/11/22  Transition Care Management Follow-up Telephone Call Date of Discharge: 04/09/22 Discharge Facility: [facility] Type of Discharge: Emergency Department Reason for ED Visit: Other: (epstaxis) How have you been since you were released from the hospital?: Better Any questions or concerns?: No  Items Reviewed: Did you receive and understand the discharge instructions provided?: Yes Medications obtained and verified?: Yes (Medications Reviewed) Any new allergies since your discharge?: No Dietary orders reviewed?: NA Do you have support at home?: Yes People in Home: spouse  Home Care and Equipment/Supplies: [place] Ordered?: NA Any new equipment or medical supplies ordered?: NA  Functional Questionnaire: Do you need assistance with bathing/showering or dressing?: No Do you need assistance with meal preparation?: No Do you need assistance with eating?: No Do you have difficulty maintaining continence: No Do you need assistance with getting out of bed/getting out of a chair/moving?: No Do you have difficulty managing or taking your medications?: No  Folllow up appointments reviewed: PCP Follow-up appointment confirmed?: Yes Date of PCP follow-up appointment?: 04/12/22 Follow-up Provider: [facility] Follow-up appointment confirmed?: NA Do you need transportation to your follow-up appointment?: No Do you understand care options if your condition(s) worsen?: Yes-patient verbalized understanding    [place], [place] Direct Dial [phone]

## 2022-04-12 ENCOUNTER — Ambulatory Visit: Payer: 59 | Admitting: Family Medicine

## 2022-04-12 ENCOUNTER — Encounter: Payer: Self-pay | Admitting: Family Medicine

## 2022-04-12 VITALS — BP 140/90 | HR 95 | Temp 98.9°F | Wt 228.0 lb

## 2022-04-12 DIAGNOSIS — R040 Epistaxis: Secondary | ICD-10-CM | POA: Insufficient documentation

## 2022-04-12 NOTE — Progress Notes (Signed)
   Subjective:    Patient ID: Nathaniel Mosley, male    DOB: November 11, 1960, 62 y.o.   MRN: [id number]  HPI Here to follow up on an ED visit on 04-09-22 for a left sided nose bleed. The bleeding had started 3 days before, but it suddenly picked up on the 10th. He had been taking an 81 mg ASA daily until the bleeding started, but he has stopped since then. At the ED, Afrin nasal spray was used, and then they applied a combination of TXA and lidocaine with epinephrine, but these were not successful. Finally the bleeding was stopped when a Rapid Rhino was inserted. Today he feels fine in general.    Review of Systems  Constitutional: Negative.   HENT:  Positive for nosebleeds. Negative for congestion, facial swelling and sinus pressure.   Eyes: Negative.   Respiratory: Negative.         Objective:   Physical Exam Constitutional:      General: He is not in acute distress.    Appearance: Normal appearance.  HENT:     Right Ear: Tympanic membrane, ear canal and external ear normal.     Left Ear: Tympanic membrane, ear canal and external ear normal.     Nose:     Comments: The left nostril has a Rhino Rocket in place    Mouth/Throat:     Pharynx: Oropharynx is clear.  Eyes:     Conjunctiva/sclera: Conjunctivae normal.  Cardiovascular:     Rate and Rhythm: Normal rate and regular rhythm.     Pulses: Normal pulses.     Heart sounds: Normal heart sounds.  Pulmonary:     Effort: Pulmonary effort is normal.     Breath sounds: Normal breath sounds.  Lymphadenopathy:     Cervical: No cervical adenopathy.  Neurological:     Mental Status: He is alert.           Assessment & Plan:  Left sided nose bleed. We gently removed the [employer], and a small trickle of blood began to come out of the nostril. This persisted despite gentle pressure on the nostril. We were able to insert a 2X2 gauze pad to stop this. He will see [facility]NT at 2:30 this afternoon for further treatment. We  spent a total of ( 33  ) minutes reviewing records and discussing these issues.  Alysia Penna, MD

## 2022-06-08 LAB — HM DIABETES EYE EXAM

## 2022-07-01 ENCOUNTER — Ambulatory Visit (INDEPENDENT_AMBULATORY_CARE_PROVIDER_SITE_OTHER): Payer: 59 | Admitting: Nurse Practitioner

## 2022-07-01 VITALS — BP 136/88 | HR 70 | Temp 97.9°F | Ht 66.0 in | Wt 225.1 lb

## 2022-07-01 DIAGNOSIS — Z0000 Encounter for general adult medical examination without abnormal findings: Secondary | ICD-10-CM | POA: Diagnosis not present

## 2022-07-01 DIAGNOSIS — Z6836 Body mass index (BMI) 36.0-36.9, adult: Secondary | ICD-10-CM

## 2022-07-01 DIAGNOSIS — E119 Type 2 diabetes mellitus without complications: Secondary | ICD-10-CM

## 2022-07-01 DIAGNOSIS — I10 Essential (primary) hypertension: Secondary | ICD-10-CM | POA: Diagnosis not present

## 2022-07-01 DIAGNOSIS — Z125 Encounter for screening for malignant neoplasm of prostate: Secondary | ICD-10-CM

## 2022-07-01 DIAGNOSIS — Z1211 Encounter for screening for malignant neoplasm of colon: Secondary | ICD-10-CM

## 2022-07-01 DIAGNOSIS — E785 Hyperlipidemia, unspecified: Secondary | ICD-10-CM

## 2022-07-01 DIAGNOSIS — Z23 Encounter for immunization: Secondary | ICD-10-CM | POA: Diagnosis not present

## 2022-07-01 DIAGNOSIS — Z0001 Encounter for general adult medical examination with abnormal findings: Secondary | ICD-10-CM

## 2022-07-01 LAB — MICROALBUMIN / CREATININE URINE RATIO
Creatinine,U: 128.6 mg/dL
Microalb Creat Ratio: 5.9 mg/g (ref 0.0–30.0)
Microalb, Ur: 7.6 mg/dL — ABNORMAL HIGH (ref 0.0–1.9)

## 2022-07-01 LAB — CBC
HCT: 47.7 % (ref 39.0–52.0)
Hemoglobin: 16.1 g/dL (ref 13.0–17.0)
MCHC: 33.7 g/dL (ref 30.0–36.0)
MCV: 86 fl (ref 78.0–100.0)
Platelets: 214 10*3/uL (ref 150.0–400.0)
RBC: 5.55 Mil/uL (ref 4.22–5.81)
RDW: 14.5 % (ref 11.5–15.5)
WBC: 4.4 10*3/uL (ref 4.0–10.5)

## 2022-07-01 LAB — LIPID PANEL
Cholesterol: 154 mg/dL (ref 0–200)
HDL: 31.8 mg/dL — ABNORMAL LOW (ref >39.00)
LDL Cholesterol: 89 mg/dL (ref 0–99)
NonHDL: 121.8
Total CHOL/HDL Ratio: 5
Triglycerides: 162 mg/dL — ABNORMAL HIGH (ref 0.0–149.0)
VLDL: 32.4 mg/dL (ref 0.0–40.0)

## 2022-07-01 LAB — COMPREHENSIVE METABOLIC PANEL
ALT: 17 U/L (ref 0–53)
AST: 34 U/L (ref 0–37)
Albumin: 4.4 g/dL (ref 3.5–5.2)
Alkaline Phosphatase: 68 U/L (ref 39–117)
BUN: 14 mg/dL (ref 6–23)
CO2: 30 mEq/L (ref 19–32)
Calcium: 9.2 mg/dL (ref 8.4–10.5)
Chloride: 102 mEq/L (ref 96–112)
Creatinine, Ser: 0.85 mg/dL (ref 0.40–1.50)
GFR: 94.3 mL/min (ref >60.00)
Glucose, Bld: 113 mg/dL — ABNORMAL HIGH (ref 70–99)
Potassium: 4.1 mEq/L (ref 3.5–5.1)
Sodium: 142 mEq/L (ref 135–145)
Total Bilirubin: 0.6 mg/dL (ref 0.2–1.2)
Total Protein: 7.2 g/dL (ref 6.0–8.3)

## 2022-07-01 LAB — PSA: PSA: 0.95 ng/mL (ref 0.10–4.00)

## 2022-07-01 LAB — HEMOGLOBIN A1C: Hgb A1c MFr Bld: 6.6 % — ABNORMAL HIGH (ref 4.6–6.5)

## 2022-07-01 LAB — TSH: TSH: 2.43 u[IU]/mL (ref 0.35–5.50)

## 2022-07-01 NOTE — Assessment & Plan Note (Signed)
Labs ordered, further recommendations may be made based upon his results. 

## 2022-07-01 NOTE — Addendum Note (Signed)
Addended by: Cathleen Fears, Niomie Englert P on: 07/01/2022 10:02 AM   Modules accepted: Orders

## 2022-07-01 NOTE — Patient Instructions (Signed)
Dr. Loreta Ave - GI doc for colon cancer screening ([phone]

## 2022-07-01 NOTE — Progress Notes (Signed)
Established Patient Office Visit  Subjective   Patient ID: Nathaniel Mosley, male    DOB: 09-27-1960  Age: 62 y.o. MRN: [id number]  Chief Complaint  Patient presents with   Annual Exam   Health maintenance: Diabetic foot exam: Due, shingles vaccine: Due, colonoscopy: 02/14/2018, Tdap: 02/26/2013, hep C screening completed, HIV screening completed  T2DM/HLD/HTN: Patient is fasting today, due to have cholesterol panel checked.  Continues on amlodipine 10 mg/day, monitors blood pressure at home reports generally systolic blood pressure is 130s over 80s to 90s.  Diabetes currently diet controlled, last A1c 6.5.  Patient has had dry cough on lisinopril in the past, not currently on ARB.  Not currently on statin therapy.     Review of Systems  Constitutional:  Negative for chills, fever, malaise/fatigue and weight loss.  HENT:  Positive for tinnitus. Negative for congestion and ear pain.   Eyes:  Negative for blurred vision and double vision.  Respiratory:  Negative for cough, shortness of breath and wheezing.   Cardiovascular:  Negative for chest pain and palpitations.  Gastrointestinal:  Negative for abdominal pain, blood in stool, constipation, diarrhea, nausea and vomiting.  Genitourinary:  Negative for dysuria and hematuria.  Musculoskeletal:  Negative for falls.  Skin:  Negative for itching and rash.  Neurological:  Negative for dizziness, seizures, loss of consciousness and headaches.  Endo/Heme/Allergies:  Negative for polydipsia.  Psychiatric/Behavioral:  Negative for depression and suicidal ideas. The patient is not nervous/anxious.       Objective:     BP 136/88   Pulse 70   Temp 97.9 F (36.6 C) (Temporal)   Ht 5\' 6"  (1.676 m)   Wt 225 lb 2 oz (102.1 kg)   SpO2 93%   BMI 36.34 kg/m  BP Readings from Last 3 Encounters:  07/01/22 136/88  04/12/22 (!) 140/90  04/09/22 (!) 135/101   Wt Readings from Last 3 Encounters:  07/01/22 225 lb 2 oz (102.1 kg)   04/12/22 228 lb (103.4 kg)  04/09/22 231 lb (104.8 kg)        07/01/2022    8:05 AM 04/12/2022    8:08 AM 12/24/2021    8:08 AM  PHQ9 SCORE ONLY  PHQ-9 Total Score 0 0 2     Physical Exam Vitals reviewed.  Constitutional:      General: He is not in acute distress.    Appearance: Normal appearance. He is not ill-appearing.  HENT:     Head: Normocephalic and atraumatic.     Right Ear: Tympanic membrane, ear canal and external ear normal.     Left Ear: Tympanic membrane, ear canal and external ear normal.  Eyes:     General: No scleral icterus.    Extraocular Movements: Extraocular movements intact.     Conjunctiva/sclera: Conjunctivae normal.     Pupils: Pupils are equal, round, and reactive to light.  Neck:     Vascular: No carotid bruit.  Cardiovascular:     Rate and Rhythm: Normal rate and regular rhythm.     Pulses: Normal pulses.     Heart sounds: Normal heart sounds.  Pulmonary:     Effort: Pulmonary effort is normal.     Breath sounds: Normal breath sounds.  Abdominal:     General: Bowel sounds are normal. There is no distension.     Palpations: There is no mass.     Tenderness: There is no abdominal tenderness.     Hernia: No hernia is present.  Musculoskeletal:  General: No swelling or tenderness.     Cervical back: Normal range of motion and neck supple. No rigidity.  Lymphadenopathy:     Cervical: No cervical adenopathy.  Skin:    General: Skin is warm and dry.  Neurological:     General: No focal deficit present.     Mental Status: He is alert and oriented to person, place, and time.     Cranial Nerves: No cranial nerve deficit.     Sensory: No sensory deficit.     Motor: No weakness.     Gait: Gait normal.  Psychiatric:        Mood and Affect: Mood normal.        Behavior: Behavior normal.        Judgment: Judgment normal.      No results found for any visits on 07/01/22.    The 10-year ASCVD risk score (Arnett DK, et al., 2019) is:  21.2%    Assessment & Plan:   Problem List Items Addressed This Visit       Cardiovascular and Mediastinum   Essential hypertension    Chronic Blood pressure better compared to last office visit however still slightly above goal. Per shared decision making patient would like to continue on amlodipine 10 mg/day as monotherapy.      Relevant Orders   CBC   Comprehensive metabolic panel   Lipid panel     Endocrine   Type 2 diabetes mellitus without complication, without long-term current use of insulin ([facility])    Chronic Last A1c 6.5 Diet controlled Check A1c today, further recommendations may be made based on the results Have had discussions in the past regarding importance of ARB and statin therapy, for now patient would prefer to continue off of these medications if possible      Relevant Orders   Hemoglobin A1c   Lipid panel   TSH   Microalbumin / creatinine urine ratio     Other   Obesity    Labs ordered, further recommendations may be made based upon his results       Relevant Orders   TSH   Encounter for general adult medical examination with abnormal findings - Primary    Patient encouraged to focus on healthy lifestyle, will administer shingles vaccine in office today he will be scheduled for second dose in 2 to 6 months.  Will order labs to further evaluation as well.  Patient provided handout with shingles VIS and preventative health care recommendations      Colon cancer screening    Will be due at the end of this year, referral back to patient's previous gastroenterology made today.  (Dr. Loreta Ave)      Relevant Orders   Ambulatory referral to Gastroenterology   Prostate cancer screening    PSA ordered today, further recommendations may be made based upon results      Relevant Orders   PSA   Hyperlipidemia    Chronic Check fasting lipid panel today, further recommendations may be made based upon these results      Relevant Orders   Lipid panel     Return in about 6 months (around 01/01/2023) for F/U with Elad Macphail and 2nd shingles shot in 2-6 months.  In addition to performing his annual physical exam I also performed an office visit as detailed above   Elenore Paddy, NP

## 2022-07-01 NOTE — Assessment & Plan Note (Signed)
Chronic Check fasting lipid panel today, further recommendations may be made based upon these results

## 2022-07-01 NOTE — Assessment & Plan Note (Signed)
Chronic Blood pressure better compared to last office visit however still slightly above goal. Per shared decision making patient would like to continue on amlodipine 10 mg/day as monotherapy.

## 2022-07-01 NOTE — Assessment & Plan Note (Signed)
PSA ordered today, further recommendations may be made based upon results

## 2022-07-01 NOTE — Assessment & Plan Note (Signed)
Will be due at the end of this year, referral back to patient's previous gastroenterology made today.  (Dr. Loreta Ave)

## 2022-07-01 NOTE — Assessment & Plan Note (Signed)
Patient encouraged to focus on healthy lifestyle, will administer shingles vaccine in office today he will be scheduled for second dose in 2 to 6 months.  Will order labs to further evaluation as well.  Patient provided handout with shingles VIS and preventative health care recommendations

## 2022-07-01 NOTE — Assessment & Plan Note (Signed)
Chronic Last A1c 6.5 Diet controlled Check A1c today, further recommendations may be made based on the results Have had discussions in the past regarding importance of ARB and statin therapy, for now patient would prefer to continue off of these medications if possible

## 2022-07-07 ENCOUNTER — Other Ambulatory Visit: Payer: Self-pay | Admitting: Nurse Practitioner

## 2022-07-07 DIAGNOSIS — B356 Tinea cruris: Secondary | ICD-10-CM

## 2022-07-15 DIAGNOSIS — M722 Plantar fascial fibromatosis: Secondary | ICD-10-CM

## 2022-07-22 ENCOUNTER — Encounter: Payer: Self-pay | Admitting: Nurse Practitioner

## 2022-09-12 ENCOUNTER — Ambulatory Visit: Payer: 59 | Admitting: *Deleted

## 2022-09-12 DIAGNOSIS — Z23 Encounter for immunization: Secondary | ICD-10-CM

## 2022-09-12 NOTE — Progress Notes (Signed)
Pls cosign for SHINGRIX inj in absence of PCP.Marland KitchenRaechel Chute

## 2022-11-07 ENCOUNTER — Other Ambulatory Visit: Payer: 59

## 2022-11-23 ENCOUNTER — Other Ambulatory Visit: Payer: 59

## 2022-12-14 ENCOUNTER — Encounter: Payer: Self-pay | Admitting: Dermatology

## 2022-12-14 ENCOUNTER — Ambulatory Visit: Payer: Managed Care, Other (non HMO) | Admitting: Dermatology

## 2022-12-14 DIAGNOSIS — L820 Inflamed seborrheic keratosis: Secondary | ICD-10-CM

## 2022-12-14 DIAGNOSIS — L308 Other specified dermatitis: Secondary | ICD-10-CM

## 2022-12-14 DIAGNOSIS — D492 Neoplasm of unspecified behavior of bone, soft tissue, and skin: Secondary | ICD-10-CM | POA: Diagnosis not present

## 2022-12-14 DIAGNOSIS — L821 Other seborrheic keratosis: Secondary | ICD-10-CM | POA: Diagnosis not present

## 2022-12-14 DIAGNOSIS — L578 Other skin changes due to chronic exposure to nonionizing radiation: Secondary | ICD-10-CM

## 2022-12-14 DIAGNOSIS — W908XXA Exposure to other nonionizing radiation, initial encounter: Secondary | ICD-10-CM

## 2022-12-14 NOTE — Progress Notes (Signed)
New Patient Visit   Subjective  Nathaniel Mosley is a 62 y.o. male who presents for the following: Spots. Back and thigh. Asymptomatic. No personal Hx of skin cancer. PCP thinks one lesion on back could be a seborrheic keratosis but other lesion suspicious for SCC.   Area on right thigh patient thinks could be ringworm.   The patient has spots, moles and lesions to be evaluated, some may be new or changing and the patient may have concern these could be cancer.    The following portions of the chart were reviewed this encounter and updated as appropriate: medications, allergies, medical history  Review of Systems:  No other skin or systemic complaints except as noted in HPI or Assessment and Plan.  Objective  Well appearing patient in no apparent distress; mood and affect are within normal limits.  A focused examination was performed of the following areas: Back and right thigh  Relevant physical exam findings are noted in the Assessment and Plan.  Left Lower Back x2, right mid to upper back x5 (7) Erythematous keratotic or waxy stuck-on papule or plaque.  Right lateral thigh 3.4 x 3 cm pink scaly plaque         Assessment & Plan   Inflamed seborrheic keratosis (7) Left Lower Back x2, right mid to upper back x5  Symptomatic, irritating, patient would like treated.   Recheck in 6-7 months  Destruction of lesion - Left Lower Back x2, right mid to upper back x5 (7) Complexity: simple   Destruction method: cryotherapy   Informed consent: discussed and consent obtained   Timeout:  patient name, date of birth, surgical site, and procedure verified Lesion destroyed using liquid nitrogen: Yes   Region frozen until ice ball extended beyond lesion: Yes   Outcome: patient tolerated procedure well with no complications   Post-procedure details: wound care instructions given   Additional details:  Prior to procedure, discussed risks of blister formation, small wound, skin  dyspigmentation, or rare scar following cryotherapy. Recommend Vaseline ointment to treated areas while healing.   Neoplasm of skin Right lateral thigh  Skin / nail biopsy Type of biopsy: tangential   Informed consent: discussed and consent obtained   Timeout: patient name, date of birth, surgical site, and procedure verified   Procedure prep:  Patient was prepped and draped in usual sterile fashion Prep type:  Isopropyl alcohol Anesthesia: the lesion was anesthetized in a standard fashion   Anesthetic:  1% lidocaine w/ epinephrine 1-100,000 buffered w/ 8.4% NaHCO3 Instrument used: flexible razor blade   Hemostasis achieved with: pressure, aluminum chloride and electrodesiccation   Outcome: patient tolerated procedure well   Post-procedure details: sterile dressing applied and wound care instructions given   Dressing type: bandage and petrolatum    Specimen 1 - Surgical pathology Differential Diagnosis: Porokeratosis vs ISK vs SCC  Check Margins: No  SEBORRHEIC KERATOSIS - Stuck-on, waxy, tan-brown papules and/or plaques  - Benign-appearing - Discussed benign etiology and prognosis. - Observe - Call for any changes  ACTINIC DAMAGE - chronic, secondary to cumulative UV radiation exposure/sun exposure over time - diffuse scaly erythematous macules with underlying dyspigmentation - Recommend daily broad spectrum sunscreen SPF 30+ to sun-exposed areas, reapply every 2 hours as needed.  - Recommend staying in the shade or wearing long sleeves, sun glasses (UVA+UVB protection) and wide brim hats (4-inch brim around the entire circumference of the hat). - Call for new or changing lesions.   Return for ISK Follow Up.  I,  Lawson Radar, CMA, am acting as scribe for Armida Sans, MD.   Documentation: I have reviewed the above documentation for accuracy and completeness, and I agree with the above.  Armida Sans, MD

## 2022-12-14 NOTE — Patient Instructions (Addendum)
Cryotherapy Aftercare  Wash gently with soap and water everyday.   Apply Vaseline Jelly daily until healed.    Wound Care Instructions  Cleanse wound gently with soap and water once a day then pat dry with clean gauze. Apply a thin coat of Petrolatum (petroleum jelly, "Vaseline") over the wound (unless you have an allergy to this). We recommend that you use a new, sterile tube of Vaseline. Do not pick or remove scabs. Do not remove the yellow or white "healing tissue" from the base of the wound.  Cover the wound with fresh, clean, nonstick gauze and secure with paper tape. You may use Band-Aids in place of gauze and tape if the wound is small enough, but would recommend trimming much of the tape off as there is often too much. Sometimes Band-Aids can irritate the skin.  You should call the office for your biopsy report after 1 week if you have not already been contacted.  If you experience any problems, such as abnormal amounts of bleeding, swelling, significant bruising, significant pain, or evidence of infection, please call the office immediately.  FOR ADULT SURGERY PATIENTS: If you need something for pain relief you may take 1 extra strength Tylenol (acetaminophen) AND 2 Ibuprofen (200mg  each) together every 4 hours as needed for pain. (do not take these if you are allergic to them or if you have a reason you should not take them.) Typically, you may only need pain medication for 1 to 3 days.      Recommend daily broad spectrum sunscreen SPF 30+ to sun-exposed areas, reapply every 2 hours as needed. Call for new or changing lesions.  Staying in the shade or wearing long sleeves, sun glasses (UVA+UVB protection) and wide brim hats (4-inch brim around the entire circumference of the hat) are also recommended for sun protection.    Due to recent changes in healthcare laws, you may see results of your pathology and/or laboratory studies on MyChart before the doctors have had a chance to  review them. We understand that in some cases there may be results that are confusing or concerning to you. Please understand that not all results are received at the same time and often the doctors may need to interpret multiple results in order to provide you with the best plan of care or course of treatment. Therefore, we ask that you please give [place] 2 business days to thoroughly review all your results before contacting the office for clarification. Should we see a critical lab result, you will be contacted sooner.   If You Need Anything After Your Visit  If you have any questions or concerns for your doctor, please call our main line at [phone] and press option 4 to reach your doctor's medical assistant. If no one answers, please leave a voicemail as directed and we will return your call as soon as possible. Messages left after 4 pm will be answered the following business day.   You may also send [place] a message via MyChart. We typically respond to MyChart messages within 1-2 business days.  For prescription refills, please ask your pharmacy to contact our office. Our fax number is [phone].  If you have an urgent issue when the clinic is closed that cannot wait until the next business day, you can page your doctor at the number below.    Please note that while we do our best to be available for urgent issues outside of office hours, we are not available 24/7.  If you have an urgent issue and are unable to reach [place], you may choose to seek medical care at your doctor's office, retail clinic, urgent care center, or emergency room.  If you have a medical emergency, please immediately call 911 or go to the emergency department.  Pager Numbers  - Dr. Gwen Pounds: [phone]  - Dr. Roseanne Reno: [phone]  - Dr. Katrinka Blazing: [phone]   In the event of inclement weather, please call our main line at [phone] for an update on the status of any delays or closures.  Dermatology  Medication Tips: Please keep the boxes that topical medications come in in order to help keep track of the instructions about where and how to use these. Pharmacies typically print the medication instructions only on the boxes and not directly on the medication tubes.   If your medication is too expensive, please contact our office at [phone] option 4 or send [place] a message through MyChart.   We are unable to tell what your co-pay for medications will be in advance as this is different depending on your insurance coverage. However, we may be able to find a substitute medication at lower cost or fill out paperwork to get insurance to cover a needed medication.   If a prior authorization is required to get your medication covered by your insurance company, please allow [place] 1-2 business days to complete this process.  Drug prices often vary depending on where the prescription is filled and some pharmacies may offer cheaper prices.  The website www.goodrx.com contains coupons for medications through different pharmacies. The prices here do not account for what the cost may be with help from insurance (it may be cheaper with your insurance), but the website can give you the price if you did not use any insurance.  - You can print the associated coupon and take it with your prescription to the pharmacy.  - You may also stop by our office during regular business hours and pick up a GoodRx coupon card.  - If you need your prescription sent electronically to a different pharmacy, notify our office through [facility] or by phone at [phone] option 4.     Si Usted Necesita Algo Despus de Su Visita  Tambin puede enviarnos un mensaje a travs de Clinical cytogeneticist. Por lo general respondemos a los mensajes de MyChart en el transcurso de 1 a 2 das hbiles.  Para renovar recetas, por favor pida a su farmacia que se ponga en contacto con nuestra oficina. Annie Sable de fax es [place] [phone].  Si  tiene un asunto urgente cuando la clnica est cerrada y que no puede esperar hasta el siguiente da hbil, puede llamar/localizar a su doctor(a) al nmero que aparece a continuacin.   Por favor, tenga en cuenta que aunque hacemos todo lo posible para estar disponibles para asuntos urgentes fuera del horario de [place], no estamos disponibles las 24 horas del da, los 7 [address]  [address] de la [place].   Si tiene un problema urgente y no puede comunicarse con nosotros, puede optar por buscar atencin mdica  en el consultorio de su doctor(a), en una clnica privada, en un centro de atencin urgente o en una sala de emergencias.  Si tiene Engineer, drilling, por favor llame inmediatamente al 911 o vaya a la sala de emergencias.  Nmeros de bper  - Dr. Gwen Pounds: [phone]  - Dra. Roseanne Reno: [phone]  - Dr. Katrinka Blazing: [phone]   En caso de inclemencias del tiempo, por favor llame a Ferne Coe  lnea principal al [phone] para una actualizacin sobre el [place] de cualquier retraso o cierre.  Consejos para la medicacin en dermatologa: Por favor, guarde las cajas en las que vienen los medicamentos de uso tpico para ayudarle a seguir las instrucciones sobre dnde y cmo usarlos. Las farmacias generalmente imprimen las instrucciones del medicamento slo en las cajas y no directamente en los tubos del [place].   Si su medicamento es muy caro, por favor, pngase en contacto con Rolm Gala llamando al [phone] y presione la opcin 4 o envenos un mensaje a travs de Clinical cytogeneticist.   No podemos decirle cul ser su copago por los medicamentos por adelantado ya que esto es diferente dependiendo de la cobertura de su seguro. Sin embargo, es posible que podamos encontrar un medicamento sustituto a Audiological scientist un formulario para que el seguro cubra el medicamento que se considera necesario.   Si se requiere una autorizacin previa para que su compaa de seguros [place] su medicamento, por  favor permtanos de 1 a 2 das hbiles para completar [address]  Los precios de los medicamentos varan con frecuencia dependiendo del Environmental consultant de dnde se surte la receta y alguna farmacias pueden ofrecer precios ms baratos.  El sitio web www.goodrx.com tiene cupones para medicamentos de Health and safety inspector. Los precios aqu no tienen en cuenta lo que podra costar con la ayuda del seguro (puede ser ms barato con su seguro), pero el sitio web puede darle el precio si no utiliz Tourist information centre manager.  - Puede imprimir el cupn correspondiente y llevarlo con su receta a la farmacia.  - Tambin puede pasar por nuestra oficina durante el horario de atencin regular y Education officer, museum una tarjeta de cupones de GoodRx.  - Si necesita que su receta se enve electrnicamente a una farmacia diferente, informe a nuestra oficina a travs de MyChart de [place] o por telfono llamando al [phone] y presione la opcin 4.

## 2022-12-16 LAB — SURGICAL PATHOLOGY

## 2022-12-20 ENCOUNTER — Telehealth: Payer: Self-pay

## 2022-12-20 MED ORDER — MOMETASONE FUROATE 0.1 % EX CREA: 1.0000 | TOPICAL_CREAM | CUTANEOUS | 1 refills | Status: DC

## 2022-12-20 NOTE — Telephone Encounter (Signed)
LM on VM please return my call  

## 2022-12-20 NOTE — Telephone Encounter (Signed)
Advised pt of bx results.  Advised pt Dr. Gwen Pounds wanted him to start Mometasone cr bid 5d/wk for a few weeks and then send a mychart message in 5 weeks to let us know how he is doing.

## 2022-12-20 NOTE — Telephone Encounter (Signed)
-----   Message from Armida Sans sent at 12/20/2022 11:30 AM EDT ----- FINAL DIAGNOSIS        1. Skin, right lateral thigh :       PSORIASIFORM SPONGIOTIC DERMATITIS, SEE DESCRIPTION   Most consistent with eczema. Send Mometasone cream bid 5 days per week for several weeks. After 5 weeks, send MyChart message to let us know how doing.

## 2022-12-27 ENCOUNTER — Encounter: Payer: Self-pay | Admitting: Dermatology

## 2023-01-05 ENCOUNTER — Ambulatory Visit: Payer: 59 | Admitting: Nurse Practitioner

## 2023-01-07 ENCOUNTER — Other Ambulatory Visit: Payer: Self-pay | Admitting: Nurse Practitioner

## 2023-01-07 DIAGNOSIS — I10 Essential (primary) hypertension: Secondary | ICD-10-CM

## 2023-01-25 ENCOUNTER — Telehealth: Payer: Self-pay | Admitting: Podiatry

## 2023-01-25 NOTE — Telephone Encounter (Signed)
Pt called checking on status of orthotics that we had to reorder as his were lost in may. He said there was a rush put on his order. Please call pt as he said he has left a few messages and no one has called him back.

## 2023-02-02 ENCOUNTER — Ambulatory Visit (INDEPENDENT_AMBULATORY_CARE_PROVIDER_SITE_OTHER): Payer: Managed Care, Other (non HMO) | Admitting: Nurse Practitioner

## 2023-02-02 ENCOUNTER — Ambulatory Visit: Payer: Managed Care, Other (non HMO)

## 2023-02-02 VITALS — BP 130/88 | HR 74 | Temp 98.3°F | Ht 66.0 in | Wt 227.5 lb

## 2023-02-02 DIAGNOSIS — E119 Type 2 diabetes mellitus without complications: Secondary | ICD-10-CM | POA: Diagnosis not present

## 2023-02-02 DIAGNOSIS — I10 Essential (primary) hypertension: Secondary | ICD-10-CM | POA: Diagnosis not present

## 2023-02-02 DIAGNOSIS — M25512 Pain in left shoulder: Secondary | ICD-10-CM

## 2023-02-02 LAB — POCT GLYCOSYLATED HEMOGLOBIN (HGB A1C): Hemoglobin A1C: 6.4 % — AB (ref 4.0–5.6)

## 2023-02-02 NOTE — Patient Instructions (Signed)
A1C-6.4  

## 2023-02-02 NOTE — Assessment & Plan Note (Signed)
Chronic, stable.  Continue amlodipine 10 mg daily 

## 2023-02-02 NOTE — Assessment & Plan Note (Addendum)
Chronic POC A1c today: 6.4 Patient encouraged to continue focusing on lifestyle modification with focus on diet of lean proteins, fruits, and vegetables.

## 2023-02-02 NOTE — Assessment & Plan Note (Signed)
Acute Triggered by overuse Appears to be musculoskeletal in etiology Rest and monitor.  Patient encouraged to notify me if symptoms persist or worsen.

## 2023-02-02 NOTE — Progress Notes (Signed)
Established Patient Office Visit  Subjective   Patient ID: Nathaniel Mosley, male    DOB: Jul 02, 1960  Age: 62 y.o. MRN: [id number]  Chief Complaint  Patient presents with   Diabetes    Patient here for chronic care follow-up.  Type 2 diabetes/hypertension: Diabetes currently diet controlled.  Patient on amlodipine 10 mg daily for blood pressure.  Tolerating amlodipine well.  Last A1c 6.6.  No albuminuria noted in urine testing about 6 months ago. Left shoulder pain: Occurred after lifting heavy objects above his head repeatedly.  Pain is mild to moderate in intensity.  Is working on resting his shoulder, not currently interested in further workup or evaluation today.  Denies any sensory changes or weakness.    Review of Systems  Respiratory:  Negative for shortness of breath.   Cardiovascular:  Negative for chest pain.  Musculoskeletal:  Positive for joint pain.      Objective:     BP 130/88   Pulse 74   Temp 98.3 F (36.8 C) (Temporal)   Ht 5\' 6"  (1.676 m)   Wt 227 lb 8 oz (103.2 kg)   SpO2 93%   BMI 36.72 kg/m  BP Readings from Last 3 Encounters:  02/02/23 130/88  07/01/22 136/88  04/12/22 (!) 140/90   Wt Readings from Last 3 Encounters:  02/02/23 227 lb 8 oz (103.2 kg)  07/01/22 225 lb 2 oz (102.1 kg)  04/12/22 228 lb (103.4 kg)      Physical Exam Vitals reviewed.  Constitutional:      Appearance: Normal appearance.  HENT:     Head: Normocephalic and atraumatic.  Cardiovascular:     Rate and Rhythm: Normal rate and regular rhythm.  Pulmonary:     Effort: Pulmonary effort is normal.     Breath sounds: Normal breath sounds.  Musculoskeletal:     Cervical back: Neck supple.  Skin:    General: Skin is warm and dry.  Neurological:     Mental Status: He is alert and oriented to person, place, and time.  Psychiatric:        Mood and Affect: Mood normal.        Behavior: Behavior normal.        Thought Content: Thought content normal.         Judgment: Judgment normal.      Results for orders placed or performed in visit on 02/02/23  POCT HgB A1C  Result Value Ref Range   Hemoglobin A1C 6.4 (A) 4.0 - 5.6 %   HbA1c POC (<> result, manual entry)     HbA1c, POC (prediabetic range)     HbA1c, POC (controlled diabetic range)        The 10-year ASCVD risk score (Arnett DK, et al., 2019) is: 22.1%    Assessment & Plan:   Problem List Items Addressed This Visit       Cardiovascular and Mediastinum   Essential hypertension - Primary    Chronic, stable Continue amlodipine 10 mg daily        Endocrine   Type 2 diabetes mellitus without complication, without long-term current use of insulin (HCC)    Chronic POC A1c today: 6.4 Patient encouraged to continue focusing on lifestyle modification with focus on diet of lean proteins, fruits, and vegetables.      Relevant Orders   POCT HgB A1C (Completed)     Other   Left shoulder pain    Acute Triggered by overuse Appears to be musculoskeletal in  etiology Rest and monitor.  Patient encouraged to notify me if symptoms persist or worsen.       Return in about 6 months (around 08/03/2023) for CPE with Maralyn Sago.    Elenore Paddy, NP

## 2023-02-02 NOTE — Progress Notes (Signed)
Patient was here and received replacement pair of orthotics would from May for the ones we lost  He had paid $90 for the lost refurbished pair would like orthotics he has that were made her several years ago refurbished we can use the money he paid for the may pair for these to be sent out and re-furbished  Need to add arch pad to lift arch neoprene material  Addison Bailey Cped, CFo, CFm

## 2023-03-03 ENCOUNTER — Telehealth: Payer: Self-pay

## 2023-03-03 NOTE — Telephone Encounter (Signed)
 Copied from CRM [id number]. Topic: Referral - Request for Referral >> Mar 02, 2023  4:50 PM Taleah C wrote: Did the patient discuss referral with their provider in the last year? Yes  Appointment offered? No  Type of order/referral and detailed reason for visit: orthopedics   Preference of office, provider, location: [facility] at [facility]  Patient called and stated that Lauraine mentioned to him a few weeks ago about sending a referral for him to sports medicine/orthopedics downstairs. He explained that he told her to wait before doing so to see if over time his shoulder pain got better. However, patient stated he thinks he may need the referral now as his sxs has not improved. He asked for a callback at [phone]. Please call and advise.

## 2023-03-24 ENCOUNTER — Telehealth: Payer: Self-pay

## 2023-03-24 NOTE — Telephone Encounter (Signed)
Spoke with pt that he is able to pick up his refurb orthotics

## 2023-04-03 ENCOUNTER — Encounter: Payer: Self-pay | Admitting: Nurse Practitioner

## 2023-04-06 ENCOUNTER — Other Ambulatory Visit: Payer: Self-pay

## 2023-04-06 DIAGNOSIS — M25512 Pain in left shoulder: Secondary | ICD-10-CM

## 2023-04-10 NOTE — Progress Notes (Signed)
   Rubin Payor, PhD, LAT, ATC acting as a scribe for [name], MD.  Nathaniel Mosley is a 63 y.o. male who presents to [facility] at [facility] today for L shoulder pain ongoing since Aug-Sept. He recalls lifting an object at work, overhead, and feeling a "twinge." Pt locates pain to deep w/in the joint and superiorly. Pain is disturbing his sleep.  Radiates: no Aggravates: IR, overhead Treatments tried: rest, Tylenol, naproxen, heat   Pertinent review of systems: No fevers or chills  Relevant historical information: Diabetes.  Hypertension. 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   Exam:  BP (!) 156/94   Pulse 83   Ht 5\' 6"  (1.676 m)   Wt 233 lb (105.7 kg)   SpO2 90%   BMI 37.61 kg/m  General: Well Developed, well nourished, and in no acute distress.   MSK: Left shoulder normal-appearing Normal motion pain with abduction. Strength 4/5 abduction and external rotation.  5/5 internal rotation. Positive empty can test. Positive Hawkins and Neer's test. Negative Yergason's and speeds test.    Lab and Radiology Results  Diagnostic Limited MSK Ultrasound of: Left shoulder Body habitus limits ultrasound accuracy. Biceps tendon intact Subscapularis tendon intact without visible tear. Supraspinatus tendon intact without retracted visible tear. Mild subacromial bursitis is present. Infraspinatus tendon is intact without visible tear. AC joint degenerative appearing with effusion. Impression: AC DJD.  No large retracted rotator cuff tear.  X-ray images left shoulder obtained today personally and independently interpreted. AC DJD.  No severe glenohumeral DJD.  No acute fractures are visible. Await formal radiology review    Assessment and Plan: 63 y.o. male with chronic left shoulder pain ongoing for several months.  Pain due to rotator cuff impingement and bursitis.  He is a good candidate for physical therapy.  Plan  to refer to PT and check back in 8 weeks.  We could proceed with a cortisone injection anytime especially if not doing well.  For now we will try to hold off on a steroid injection especially given his diabetes.   PDMP not reviewed this encounter. Orders Placed This Encounter  Procedures   [place] LIMITED JOINT SPACE STRUCTURES UP LEFT(NO LINKED CHARGES)    Reason for Exam (SYMPTOM  OR DIAGNOSIS REQUIRED):   left shoulder pain    Preferred imaging location?:   [facility] Shoulder Left    Standing Status:   Future    Number of Occurrences:   1    Expiration Date:   05/09/2023    Reason for Exam (SYMPTOM  OR DIAGNOSIS REQUIRED):   left shoulder pain    Preferred imaging location?:   [facility]   Ambulatory referral to Physical Therapy    Referral Priority:   Routine    Referral Type:   Physical Medicine    Referral Reason:   Specialty Services Required    Requested Specialty:   Physical Therapy    Number of Visits Requested:   1   No orders of the defined types were placed in this encounter.    Discussed warning signs or symptoms. Please see discharge instructions. Patient expresses understanding.   The above documentation has been reviewed and is accurate and complete [name], M.D.

## 2023-04-11 ENCOUNTER — Ambulatory Visit (INDEPENDENT_AMBULATORY_CARE_PROVIDER_SITE_OTHER): Payer: 59 | Admitting: Family Medicine

## 2023-04-11 ENCOUNTER — Ambulatory Visit: Payer: Self-pay

## 2023-04-11 ENCOUNTER — Ambulatory Visit (INDEPENDENT_AMBULATORY_CARE_PROVIDER_SITE_OTHER): Payer: 59

## 2023-04-11 VITALS — BP 156/94 | HR 83 | Ht 66.0 in | Wt 233.0 lb

## 2023-04-11 DIAGNOSIS — G8929 Other chronic pain: Secondary | ICD-10-CM | POA: Diagnosis not present

## 2023-04-11 DIAGNOSIS — M25512 Pain in left shoulder: Secondary | ICD-10-CM | POA: Diagnosis not present

## 2023-04-11 NOTE — Patient Instructions (Addendum)
Thank you for coming in today.   Please get an Xray today before you leave   I've referred you to Physical Therapy.  Let us know if you don't hear from them in one week.   Check back in 8 weeks

## 2023-04-24 ENCOUNTER — Encounter: Payer: Self-pay | Admitting: Family Medicine

## 2023-04-24 NOTE — Progress Notes (Signed)
 Left shoulder x-ray shows a little bit of arthritis at the small joint at the top of the shoulder.

## 2023-04-27 ENCOUNTER — Ambulatory Visit: Payer: 59 | Admitting: Physical Therapy

## 2023-04-27 ENCOUNTER — Encounter: Payer: Self-pay | Admitting: Physical Therapy

## 2023-04-27 DIAGNOSIS — G8929 Other chronic pain: Secondary | ICD-10-CM | POA: Diagnosis not present

## 2023-04-27 DIAGNOSIS — R600 Localized edema: Secondary | ICD-10-CM | POA: Diagnosis not present

## 2023-04-27 DIAGNOSIS — M6281 Muscle weakness (generalized): Secondary | ICD-10-CM | POA: Diagnosis not present

## 2023-04-27 DIAGNOSIS — M25512 Pain in left shoulder: Secondary | ICD-10-CM | POA: Diagnosis not present

## 2023-04-27 NOTE — Therapy (Signed)
 OUTPATIENT PHYSICAL THERAPY SHOULDER EVALUATION   Patient Name: Nathaniel Mosley MRN: [id number] DOB:Sep 21, 1960, 63 y.o., male Today's Date: 04/27/2023  END OF SESSION:  PT End of Session - 04/27/23 1510     Visit Number 1    Number of Visits 8    Date for PT Re-Evaluation 06/22/23    PT Start Time 1345    PT Stop Time 1425    PT Time Calculation (min) 40 min    Activity Tolerance Patient tolerated treatment well    Behavior During Therapy [facility] for tasks assessed/performed             Past Medical History:  Diagnosis Date   Allergy    History of kidney stones    Hypertension    Kidney stone on left side    Past Surgical History:  Procedure Laterality Date   EXTRACORPOREAL SHOCK WAVE LITHOTRIPSY Left 08/13/2018   Procedure: EXTRACORPOREAL SHOCK WAVE LITHOTRIPSY (ESWL);  Surgeon: Crist Fat, MD;  Location: [facility];  Service: Urology;  Laterality: Left;   FRACTURE SURGERY     right elbow- 20 + years ago   OPEN REPAIR PERIARTICULAR FRACTURE / DISLOCATION ELBOW  1998   PLANTAR FASCIA RELEASE Bilateral 2013   right cheek  8 years ago   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   TONSILLECTOMY     as a child   Patient Active Problem List   Diagnosis Date Noted   Left shoulder pain 02/02/2023   Colon cancer screening 07/01/2022   Prostate cancer screening 07/01/2022   Hyperlipidemia 07/01/2022   Posterior epistaxis 04/12/2022   Constipation 12/24/2021   History of colonic polyps 12/24/2021   Ureteric stone 12/24/2021   Tinnitus of both ears 12/24/2021   Tinea cruris 06/24/2021   Type 2 diabetes mellitus without complication, without long-term current use of insulin (HCC) 06/24/2021   Encounter for general adult medical examination with abnormal findings 06/24/2021   Viral wart on finger 06/24/2021   Hypertriglyceridemia 06/24/2021   Umbilical hernia without obstruction and without gangrene 06/24/2021   Trigger finger of left hand 08/16/2019   Obesity  06/06/2016   Left lateral epicondylitis 12/09/2015   Seasonal allergies 07/07/2011   Kidney stone 07/07/2011   Essential hypertension 04/20/2011    PCP: Elenore Paddy, NP   REFERRING PROVIDER: Rodolph Bong, MD   REFERRING DIAG: [phone] (ICD-10-CM) - Chronic left shoulder pain  THERAPY DIAG:  Chronic left shoulder pain  Muscle weakness (generalized)  Localized edema  Rationale for Evaluation and Treatment: Rehabilitation  ONSET DATE: L shoulder pain ongoing since Aug-Sept.  SUBJECTIVE:  SUBJECTIVE STATEMENT:L shoulder pain ongoing since Aug-Sept. He does not recall any injury, does get twinges of pain with lifting at work.   Hand dominance: Right  PERTINENT HISTORY: DM  PAIN:  NPRS scale: 0 at rest today 4/10 with  Pain location:Rt anterior shoulder Pain description: constant, achy, sharp Aggravating factors: reaching out and up, sleeping Relieving factors: rest, meds   PRECAUTIONS: None  RED FLAGS: None   WEIGHT BEARING RESTRICTIONS: No  FALLS:  Has patient fallen in last 6 months? No  OCCUPATION: Works at [employer] does have to do heavy lifting and carry  PLOF: Independent  PATIENT GOALS:reduce the pain  NEXT MD VISIT:   OBJECTIVE:  Note: Objective measures were completed at Evaluation unless otherwise noted.  DIAGNOSTIC FINDINGS:  Diagnostic Limited MSK Ultrasound of: Left shoulder Body habitus limits ultrasound accuracy. Biceps tendon intact Subscapularis tendon intact without visible tear. Supraspinatus tendon intact without retracted visible tear. Mild subacromial bursitis is present. Infraspinatus tendon is intact without visible tear. AC joint degenerative appearing with effusion.  Impression: AC DJD.  No large retracted rotator cuff  tear.   X-ray images left shoulder obtained today personally and independently interpreted. AC DJD.  No severe glenohumeral DJD.  No acute fractures are visible. Await formal radiology review  PATIENT SURVEYS:  Patient-Specific Activity Scoring Scheme  "0" represents "unable to perform." "10" represents "able to perform at prior level. 0 1 2 3 4 5 6 7 8 9  10 (Date and Score)   Activity Eval     1. Lift arm above shoulder height 4     2. Sleep comfortably  6                Score 10/20    Total score = sum of the activity scores/number of activities Minimum detectable change (90%CI) for average score = 2 points Minimum detectable change (90%CI) for single activity score = 3 points  UPPER EXTREMITY ROM:   Active ROM Right eval Left eval  Shoulder flexion  130  Shoulder extension    Shoulder abduction  140  Shoulder adduction    Shoulder internal rotation  WFL  Shoulder external rotation  [facility]  Elbow flexion    Elbow extension    Wrist flexion    Wrist extension    Wrist ulnar deviation    Wrist radial deviation    Wrist pronation    Wrist supination    (Blank rows = not tested)  UPPER EXTREMITY MMT:  MMT Right eval Left eval  Shoulder flexion  4  Shoulder extension  4+  Shoulder abduction  5  Shoulder adduction    Shoulder internal rotation  5  Shoulder external rotation  4  Middle trapezius  4  Lower trapezius  4+  Elbow flexion  5  Elbow extension  5  Wrist flexion    Wrist extension    Wrist ulnar deviation    Wrist radial deviation    Wrist pronation    Wrist supination    Grip strength (lbs)    (Blank rows = not tested)  SHOULDER SPECIAL TESTS: Impingement tests: Neer impingement test: positive , Hawkins/Kennedy impingement test: positive , and Painful arc test: positive   EDEMA:  Mild edema noted left shoulder  PALPATION:  Tender to palpation around anterior-superior shoulder  TODAY'S TREATMENT:  Eval HEP creation and review with demonstration and trial set preformed, see below for details  Iontophoresis 4-6 hour wear home patch with 1.0CC dexamethasone and saline to left anterior shoulder. Verbal instructions and education provided.   -Vasopnuematic device X 10 min, medium compression, 34 deg to Lt shoulder  PATIENT EDUCATION: Education details: HEP, PT plan of care Person educated: Patient Education method: Explanation, Demonstration, Verbal cues, and Handouts Education comprehension: verbalized understanding and needs further education   HOME EXERCISE PROGRAM: Access Code: [email or web address] URL: [email or web address] Date: 04/27/2023 Prepared by: Ivery Quale  Exercises - Standing Shoulder Posterior Capsule Stretch  - 2 x daily - 6 x weekly - 1 sets - 5 reps - 10 sec hold - Doorway Pec Stretch at 90 Degrees Abduction  - 2 x daily - 6 x weekly - 1 sets - 5 reps - 10 sec hold - Shoulder External Rotation with Anchored Resistance  - 2 x daily - 6 x weekly - 2 sets - 10-15 reps - Standing Shoulder Horizontal Abduction with Resistance  - 2 x daily - 6 x weekly - 2 sets - 10-15 reps - Standing Row with Anchored Resistance  - 2 x daily - 6 x weekly - 2 sets - 10-15 reps - Shoulder extension with resistance - Neutral  - 2 x daily - 6 x weekly - 2 sets - 10-15 reps  ASSESSMENT:  CLINICAL IMPRESSION: 63 y.o. male with chronic left shoulder pain ongoing for several months.  Pain and symptoms consistent with rotator cuff impingement and bursitis. Patient will benefit from skilled PT to address below impairments, limitations and improve overall function.  OBJECTIVE IMPAIRMENTS: decreased activity tolerance, decreased shoulder mobility, decreased ROM, decreased strength, impaired flexibility, impaired UE use, postural dysfunction, and pain.  ACTIVITY LIMITATIONS: reaching, lifting, carry,   cleaning, driving, and or occupation  PERSONAL FACTORS: DM also affecting patient's functional outcome.  REHAB POTENTIAL: Good  CLINICAL DECISION MAKING: Stable/uncomplicated  EVALUATION COMPLEXITY: Low    GOALS: Short term PT Goals Target date: 05/25/2023   Pt will be I and compliant with HEP. Baseline:  Goal status: New Pt will decrease pain by 25% overall with reaching overhead Baseline:4 Goal status: New  Long term PT goals Target date:06/22/2023   Pt will improve Lt shoulder AROM to WNL to improve functional reaching Baseline: Goal status: New Pt will improve  Rt shoulder strength to  5/5 MMT to improve functional strength Baseline: Goal status: New Pt will improve PSFS to at least 15/30 functional to show improved function Baseline: Goal status: New Pt will reduce pain to overall less than 3/10 with reaching and sleeping Baseline: Goal status: New  PLAN: PT FREQUENCY: 1 times per week   PT DURATION: 8 weeks  PLANNED INTERVENTIONS (unless contraindicated): aquatic PT, Canalith repositioning, cryotherapy, Electrical stimulation, Iontophoresis with 4 mg/ml dexamethasome, Moist heat, traction, Ultrasound, gait training, Therapeutic exercise, balance training, neuromuscular re-education, patient/family education, manual techniques, passive ROM, dry needling, taping, vasopnuematic device, vestibular, spinal manipulations, joint manipulations 97110-Therapeutic exercises, 97530- Therapeutic activity, 97112- Neuromuscular re-education, 97535- Self Care, 16109- Manual therapy, and 97033- Ionotophoresis 4mg /ml Dexamethasone  PLAN FOR NEXT SESSION: review HEP, how was ionto and vaso and repeat if desired    April Manson, PT,DPT 04/27/2023, 3:11 PM

## 2023-05-10 ENCOUNTER — Encounter: Payer: Self-pay | Admitting: Physical Therapy

## 2023-05-10 ENCOUNTER — Ambulatory Visit: Payer: 59 | Admitting: Physical Therapy

## 2023-05-10 DIAGNOSIS — M6281 Muscle weakness (generalized): Secondary | ICD-10-CM

## 2023-05-10 DIAGNOSIS — M25512 Pain in left shoulder: Secondary | ICD-10-CM

## 2023-05-10 DIAGNOSIS — R600 Localized edema: Secondary | ICD-10-CM

## 2023-05-10 DIAGNOSIS — G8929 Other chronic pain: Secondary | ICD-10-CM | POA: Diagnosis not present

## 2023-05-10 NOTE — Therapy (Signed)
 OUTPATIENT PHYSICAL THERAPY TREATMENT   Patient Name: Nathaniel Mosley MRN: [id number] DOB:[birth year], 63 y.o., male Today's Date: 05/10/2023  END OF SESSION:  PT End of Session - 05/10/23 0838     Visit Number 2    Number of Visits 8    Date for PT Re-Evaluation 06/22/23    PT Start Time 0838    PT Stop Time 0916    PT Time Calculation (min) 38 min    Activity Tolerance Patient tolerated treatment well    Behavior During Therapy [facility] for tasks assessed/performed             Past Medical History:  Diagnosis Date   Allergy    History of kidney stones    Hypertension    Kidney stone on left side    Past Surgical History:  Procedure Laterality Date   EXTRACORPOREAL SHOCK WAVE LITHOTRIPSY Left 08/13/2018   Procedure: EXTRACORPOREAL SHOCK WAVE LITHOTRIPSY (ESWL);  Surgeon: Crist Fat, MD;  Location: [facility];  Service: Urology;  Laterality: Left;   FRACTURE SURGERY     right elbow- 20 + years ago   OPEN REPAIR PERIARTICULAR FRACTURE / DISLOCATION ELBOW  1998   PLANTAR FASCIA RELEASE Bilateral 2013   right cheek  8 years ago   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   TONSILLECTOMY     as a child   Patient Active Problem List   Diagnosis Date Noted   Left shoulder pain 02/02/2023   Colon cancer screening 07/01/2022   Prostate cancer screening 07/01/2022   Hyperlipidemia 07/01/2022   Posterior epistaxis 04/12/2022   Constipation 12/24/2021   History of colonic polyps 12/24/2021   Ureteric stone 12/24/2021   Tinnitus of both ears 12/24/2021   Tinea cruris 06/24/2021   Type 2 diabetes mellitus without complication, without long-term current use of insulin (HCC) 06/24/2021   Encounter for general adult medical examination with abnormal findings 06/24/2021   Viral wart on finger 06/24/2021   Hypertriglyceridemia 06/24/2021   Umbilical hernia without obstruction and without gangrene 06/24/2021   Trigger finger of left hand 08/16/2019   Obesity 06/06/2016    Left lateral epicondylitis 12/09/2015   Seasonal allergies 07/07/2011   Kidney stone 07/07/2011   Essential hypertension 04/20/2011    PCP: Elenore Paddy, NP   REFERRING PROVIDER: Rodolph Bong, MD   REFERRING DIAG: [phone] (ICD-10-CM) - Chronic left shoulder pain  THERAPY DIAG:  Chronic left shoulder pain  Muscle weakness (generalized)  Localized edema  Rationale for Evaluation and Treatment: Rehabilitation  ONSET DATE: L shoulder pain ongoing since Aug-Sept.  SUBJECTIVE:  SUBJECTIVE STATEMENT:relays was feeling really good with the PT exercises and after the ionto last time but then he fell at work and this has aggravated shoulder some but it is improving since.  Hand dominance: Right  PERTINENT HISTORY: DM  PAIN:  NPRS scale: 0-4/10 depends Pain location:Rt anterior shoulder Pain description: constant, achy, sharp Aggravating factors: reaching out and up, sleeping Relieving factors: rest, meds   PRECAUTIONS: None  RED FLAGS: None   WEIGHT BEARING RESTRICTIONS: No  FALLS:  Has patient fallen in last 6 months? No  OCCUPATION: Works at [employer] does have to do heavy lifting and carry  PLOF: Independent  PATIENT GOALS:reduce the pain  NEXT MD VISIT:   OBJECTIVE:  Note: Objective measures were completed at Evaluation unless otherwise noted.  DIAGNOSTIC FINDINGS:  Diagnostic Limited MSK Ultrasound of: Left shoulder Body habitus limits ultrasound accuracy. Biceps tendon intact Subscapularis tendon intact without visible tear. Supraspinatus tendon intact without retracted visible tear. Mild subacromial bursitis is present. Infraspinatus tendon is intact without visible tear. AC joint degenerative appearing with effusion.  Impression: AC DJD.  No large  retracted rotator cuff tear.   X-ray images left shoulder obtained today personally and independently interpreted. AC DJD.  No severe glenohumeral DJD.  No acute fractures are visible. Await formal radiology review  PATIENT SURVEYS:  Patient-Specific Activity Scoring Scheme  "0" represents "unable to perform." "10" represents "able to perform at prior level. 0 1 2 3 4 5 6 7 8 9  10 (Date and Score)   Activity Eval     1. Lift arm above shoulder height 4     2. Sleep comfortably  6                Score 10/20    Total score = sum of the activity scores/number of activities Minimum detectable change (90%CI) for average score = 2 points Minimum detectable change (90%CI) for single activity score = 3 points  UPPER EXTREMITY ROM:   Active ROM Right eval Left eval  Shoulder flexion  130  Shoulder extension    Shoulder abduction  140  Shoulder adduction    Shoulder internal rotation  WFL  Shoulder external rotation  [facility]  Elbow flexion    Elbow extension    Wrist flexion    Wrist extension    Wrist ulnar deviation    Wrist radial deviation    Wrist pronation    Wrist supination    (Blank rows = not tested)  UPPER EXTREMITY MMT:  MMT Right eval Left eval  Shoulder flexion  4  Shoulder extension  4+  Shoulder abduction  5  Shoulder adduction    Shoulder internal rotation  5  Shoulder external rotation  4  Middle trapezius  4  Lower trapezius  4+  Elbow flexion  5  Elbow extension  5  Wrist flexion    Wrist extension    Wrist ulnar deviation    Wrist radial deviation    Wrist pronation    Wrist supination    Grip strength (lbs)    (Blank rows = not tested)  SHOULDER SPECIAL TESTS: Impingement tests: Neer impingement test: positive , Hawkins/Kennedy impingement test: positive , and Painful arc test: positive   EDEMA:  Mild edema noted left shoulder  PALPATION:  Tender to palpation around anterior-superior shoulder  TODAY'S TREATMENT:  05/10/23 HEP review with one modification needed Supine shoulder ER "stargazer" stretch 5 sec X 10, modified from standing doorway stretch as he felt that one was causing strain in neck/back Posterior capsule stretch 10 sec X 5 Horizontal abduction red 2X15 Bilat ER red 2X15 Rows red 2X15 Shoulder extensions red 2X15 Iontophoresis 4-6 hour wear home patch #2 with 1.0CC dexamethasone and saline to left anterior shoulder. Verbal instructions and education provided  Eval HEP creation and review with demonstration and trial set preformed, see below for details  Iontophoresis 4-6 hour wear home patch with 1.0CC dexamethasone and saline to left anterior shoulder. Verbal instructions and education provided.   -Vasopnuematic device X 10 min, medium compression, 34 deg to Lt shoulder  PATIENT EDUCATION: Education details: HEP, PT plan of care Person educated: Patient Education method: Explanation, Demonstration, Verbal cues, and Handouts Education comprehension: verbalized understanding and needs further education   HOME EXERCISE PROGRAM: Access Code: [email or web address] URL: [email or web address] Date: 04/27/2023 Prepared by: Ivery Quale  Exercises - Standing Shoulder Posterior Capsule Stretch  - 2 x daily - 6 x weekly - 1 sets - 5 reps - 10 sec hold - Doorway Pec Stretch at 90 Degrees Abduction  - 2 x daily - 6 x weekly - 1 sets - 5 reps - 10 sec hold - Shoulder External Rotation with Anchored Resistance  - 2 x daily - 6 x weekly - 2 sets - 10-15 reps - Standing Shoulder Horizontal Abduction with Resistance  - 2 x daily - 6 x weekly - 2 sets - 10-15 reps - Standing Row with Anchored Resistance  - 2 x daily - 6 x weekly - 2 sets - 10-15 reps - Shoulder extension with resistance - Neutral  - 2 x daily - 6 x weekly - 2 sets - 10-15 reps  ASSESSMENT:  CLINICAL IMPRESSION: he has  been doing well with his shoulder and with HEP but did have minor set back from fall so I did use ionto patch again. I did modify one of his stretches to reduce strain that he was feeling. He will follow back up in 2 weeks.   OBJECTIVE IMPAIRMENTS: decreased activity tolerance, decreased shoulder mobility, decreased ROM, decreased strength, impaired flexibility, impaired UE use, postural dysfunction, and pain.  ACTIVITY LIMITATIONS: reaching, lifting, carry,  cleaning, driving, and or occupation  PERSONAL FACTORS: DM also affecting patient's functional outcome.  REHAB POTENTIAL: Good  CLINICAL DECISION MAKING: Stable/uncomplicated  EVALUATION COMPLEXITY: Low    GOALS: Short term PT Goals Target date: 05/25/2023   Pt will be I and compliant with HEP. Baseline:  Goal status: New Pt will decrease pain by 25% overall with reaching overhead Baseline:4 Goal status: New  Long term PT goals Target date:06/22/2023   Pt will improve Lt shoulder AROM to WNL to improve functional reaching Baseline: Goal status: New Pt will improve  Rt shoulder strength to  5/5 MMT to improve functional strength Baseline: Goal status: New Pt will improve PSFS to at least 15/30 functional to show improved function Baseline: Goal status: New Pt will reduce pain to overall less than 3/10 with reaching and sleeping Baseline: Goal status: New  PLAN: PT FREQUENCY: 1 times per week   PT DURATION: 8 weeks  PLANNED INTERVENTIONS (unless contraindicated): aquatic PT, Canalith repositioning, cryotherapy, Electrical stimulation, Iontophoresis with 4 mg/ml dexamethasome, Moist heat, traction, Ultrasound, gait training, Therapeutic exercise, balance training, neuromuscular re-education, patient/family education, manual techniques, passive ROM, dry needling, taping, vasopnuematic device, vestibular, spinal manipulations,  joint manipulations 97110-Therapeutic exercises, 97530- Therapeutic activity, [id number]  Neuromuscular re-education, 97535- Self Care, 16109- Manual therapy, and 97033- Ionotophoresis 4mg /ml Dexamethasone  PLAN FOR NEXT SESSION: last visit scheduled, may need HEP progressions and may be ready to DC   April Manson, PT,DPT 05/10/2023, 9:17 AM

## 2023-05-19 ENCOUNTER — Encounter: Payer: Self-pay | Admitting: Nurse Practitioner

## 2023-05-24 ENCOUNTER — Encounter: Payer: Self-pay | Admitting: Physical Therapy

## 2023-05-24 ENCOUNTER — Ambulatory Visit: Payer: 59 | Admitting: Physical Therapy

## 2023-05-24 DIAGNOSIS — M25512 Pain in left shoulder: Secondary | ICD-10-CM

## 2023-05-24 DIAGNOSIS — M6281 Muscle weakness (generalized): Secondary | ICD-10-CM

## 2023-05-24 DIAGNOSIS — R600 Localized edema: Secondary | ICD-10-CM

## 2023-05-24 DIAGNOSIS — G8929 Other chronic pain: Secondary | ICD-10-CM | POA: Diagnosis not present

## 2023-05-24 NOTE — Therapy (Signed)
 OUTPATIENT PHYSICAL THERAPY TREATMENT   Patient Name: Sly Parlee Welton MRN: [id number] DOB:[birth year], 63 y.o., male Today's Date: 05/24/2023  END OF SESSION:  PT End of Session - 05/24/23 0801     Visit Number 3    Number of Visits 8    Date for PT Re-Evaluation 06/22/23    PT Start Time 0800    PT Stop Time 0838    PT Time Calculation (min) 38 min    Activity Tolerance Patient tolerated treatment well    Behavior During Therapy [facility] for tasks assessed/performed             Past Medical History:  Diagnosis Date   Allergy    History of kidney stones    Hypertension    Kidney stone on left side    Past Surgical History:  Procedure Laterality Date   EXTRACORPOREAL SHOCK WAVE LITHOTRIPSY Left 08/13/2018   Procedure: EXTRACORPOREAL SHOCK WAVE LITHOTRIPSY (ESWL);  Surgeon: Crist Fat, MD;  Location: [facility];  Service: Urology;  Laterality: Left;   FRACTURE SURGERY     right elbow- 20 + years ago   OPEN REPAIR PERIARTICULAR FRACTURE / DISLOCATION ELBOW  1998   PLANTAR FASCIA RELEASE Bilateral 2013   right cheek  8 years ago   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   TONSILLECTOMY     as a child   Patient Active Problem List   Diagnosis Date Noted   Left shoulder pain 02/02/2023   Colon cancer screening 07/01/2022   Prostate cancer screening 07/01/2022   Hyperlipidemia 07/01/2022   Posterior epistaxis 04/12/2022   Constipation 12/24/2021   History of colonic polyps 12/24/2021   Ureteric stone 12/24/2021   Tinnitus of both ears 12/24/2021   Tinea cruris 06/24/2021   Type 2 diabetes mellitus without complication, without long-term current use of insulin (HCC) 06/24/2021   Encounter for general adult medical examination with abnormal findings 06/24/2021   Viral wart on finger 06/24/2021   Hypertriglyceridemia 06/24/2021   Umbilical hernia without obstruction and without gangrene 06/24/2021   Trigger finger of left hand 08/16/2019   Obesity 06/06/2016    Left lateral epicondylitis 12/09/2015   Seasonal allergies 07/07/2011   Kidney stone 07/07/2011   Essential hypertension 04/20/2011    PCP: Elenore Paddy, NP   REFERRING PROVIDER: Rodolph Bong, MD   REFERRING DIAG: [phone] (ICD-10-CM) - Chronic left shoulder pain  THERAPY DIAG:  Chronic left shoulder pain  Muscle weakness (generalized)  Localized edema  Rationale for Evaluation and Treatment: Rehabilitation  ONSET DATE: L shoulder pain ongoing since Aug-Sept.  SUBJECTIVE:  SUBJECTIVE STATEMENT:relays the shoulder is getting better. He just has little aches here and there. He feels ready to transition to independent program  Hand dominance: Right  PERTINENT HISTORY: DM  PAIN:  NPRS scale: 0 upon arrival.  Pain location:Rt anterior shoulder Pain description: constant, achy, sharp Aggravating factors: reaching out and up, sleeping Relieving factors: rest, meds   PRECAUTIONS: None  RED FLAGS: None   WEIGHT BEARING RESTRICTIONS: No  FALLS:  Has patient fallen in last 6 months? No  OCCUPATION: Works at [employer] does have to do heavy lifting and carry  PLOF: Independent  PATIENT GOALS:reduce the pain  NEXT MD VISIT:   OBJECTIVE:  Note: Objective measures were completed at Evaluation unless otherwise noted.  DIAGNOSTIC FINDINGS:  Diagnostic Limited MSK Ultrasound of: Left shoulder Body habitus limits ultrasound accuracy. Biceps tendon intact Subscapularis tendon intact without visible tear. Supraspinatus tendon intact without retracted visible tear. Mild subacromial bursitis is present. Infraspinatus tendon is intact without visible tear. AC joint degenerative appearing with effusion.  Impression: AC DJD.  No large retracted rotator cuff tear.   X-ray  images left shoulder obtained today personally and independently interpreted. AC DJD.  No severe glenohumeral DJD.  No acute fractures are visible. Await formal radiology review  PATIENT SURVEYS:  Patient-Specific Activity Scoring Scheme  "0" represents "unable to perform." "10" represents "able to perform at prior level. 0 1 2 3 4 5 6 7 8 9  10 (Date and Score)   Activity Eval  05/24/23   1. Lift arm above shoulder height 4  7   2. Sleep comfortably  6 9               Score 10/20 16/20   Total score = sum of the activity scores/number of activities Minimum detectable change (90%CI) for average score = 2 points Minimum detectable change (90%CI) for single activity score = 3 points  UPPER EXTREMITY ROM:   Active ROM Right eval Left eval Left 05/24/23  Shoulder flexion  130 WNL  Shoulder extension     Shoulder abduction  140 WNL  Shoulder adduction     Shoulder internal rotation  [facility] WNL  Shoulder external rotation  [facility] WNL  Elbow flexion     Elbow extension     Wrist flexion     Wrist extension     Wrist ulnar deviation     Wrist radial deviation     Wrist pronation     Wrist supination     (Blank rows = not tested)  UPPER EXTREMITY MMT:  MMT Right eval Left eval Left 05/24/23  Shoulder flexion  4 5  Shoulder extension  4+ 5  Shoulder abduction  5 5  Shoulder adduction     Shoulder internal rotation  5 5  Shoulder external rotation  4 4+  Middle trapezius  4 5  Lower trapezius  4+ 5  Elbow flexion  5   Elbow extension  5   Wrist flexion     Wrist extension     Wrist ulnar deviation     Wrist radial deviation     Wrist pronation     Wrist supination     Grip strength (lbs)     (Blank rows = not tested)  SHOULDER SPECIAL TESTS: Impingement tests: Neer impingement test: positive , Hawkins/Kennedy impingement test: positive , and Painful arc test: positive   EDEMA:  Mild edema noted left shoulder  PALPATION:  Tender to palpation around  anterior-superior shoulder  TODAY'S TREATMENT:  05/24/23 HEP review with one modification needed, he was having pain with ER full ROM with band, so modified it to isometric walk out X 10 or limited ROM X 10 green band, he was able to perform this without the pain Supine shoulder ER "stargazer" stretch 5 sec X 10 Rows red 2X15 Shoulder extensions red 2X15 IR stretch behind back with strap demo for home Wall slides abduction 5 sec X 10 Iontophoresis 4-6 hour wear home patch #3 with 1.0CC dexamethasone and saline to left anterior shoulder. Verbal instructions and education provided  05/10/23 HEP review with one modification needed Supine shoulder ER "stargazer" stretch 5 sec X 10, modified from standing doorway stretch as he felt that one was causing strain in neck/back Posterior capsule stretch 10 sec X 5 Horizontal abduction red 2X15 Bilat ER red 2X15 Rows red 2X15 Shoulder extensions red 2X15 Iontophoresis 4-6 hour wear home patch #2 with 1.0CC dexamethasone and saline to left anterior shoulder. Verbal instructions and education provided  Eval HEP creation and review with demonstration and trial set preformed, see below for details  Iontophoresis 4-6 hour wear home patch with 1.0CC dexamethasone and saline to left anterior shoulder. Verbal instructions and education provided.   -Vasopnuematic device X 10 min, medium compression, 34 deg to Lt shoulder  PATIENT EDUCATION: Education details: HEP, PT plan of care Person educated: Patient Education method: Explanation, Demonstration, Verbal cues, and Handouts Education comprehension: verbalized understanding and needs further education   HOME EXERCISE PROGRAM: Access Code: [email or web address] URL: [email or web address] Date: 04/27/2023 Prepared by: Ivery Quale  Exercises - Standing Shoulder Posterior  Capsule Stretch  - 2 x daily - 6 x weekly - 1 sets - 5 reps - 10 sec hold - Doorway Pec Stretch at 90 Degrees Abduction  - 2 x daily - 6 x weekly - 1 sets - 5 reps - 10 sec hold - Shoulder External Rotation with Anchored Resistance  - 2 x daily - 6 x weekly - 2 sets - 10-15 reps - Standing Shoulder Horizontal Abduction with Resistance  - 2 x daily - 6 x weekly - 2 sets - 10-15 reps - Standing Row with Anchored Resistance  - 2 x daily - 6 x weekly - 2 sets - 10-15 reps - Shoulder extension with resistance - Neutral  - 2 x daily - 6 x weekly - 2 sets - 10-15 reps  ASSESSMENT:  CLINICAL IMPRESSION: He has done very well with PT, has met all PT goals except almost met strength goal as he still has slight pain and weakness into ER. I showed him how to continue to work on this at home without causing pain. At this point he has good function of his left shoulder and he feels ready to discharge to independent program.    OBJECTIVE IMPAIRMENTS: decreased activity tolerance, decreased shoulder mobility, decreased ROM, decreased strength, impaired flexibility, impaired UE use, postural dysfunction, and pain.  ACTIVITY LIMITATIONS: reaching, lifting, carry,  cleaning, driving, and or occupation  PERSONAL FACTORS: DM also affecting patient's functional outcome.  REHAB POTENTIAL: Good  CLINICAL DECISION MAKING: Stable/uncomplicated  EVALUATION COMPLEXITY: Low    GOALS: Short term PT Goals Target date: 05/25/2023   Pt will be I and compliant with HEP. Baseline:  Goal status: MET 05/24/23 Pt will decrease pain by 25% overall with reaching overhead Baseline:4 Goal status: MET 05/24/23  Long term PT goals Target date:06/22/2023   Pt will improve Lt shoulder AROM to WNL to improve functional reaching Baseline:  Goal status: MET 05/24/23 Pt will improve  Rt shoulder strength to  5/5 MMT to improve functional strength Baseline: Goal status: mostly MET 05/24/23, slight weakness and pain with ER Pt  will improve PSFS to at least 15/20 functional to show improved function Baseline: Goal status: MET 05/24/23 Pt will reduce pain to overall less than 3/10 with reaching and sleeping Baseline: Goal status: MET 05/24/23  PLAN: PT FREQUENCY: 1 times per week   PT DURATION: 8 weeks  PLANNED INTERVENTIONS (unless contraindicated): aquatic PT, Canalith repositioning, cryotherapy, Electrical stimulation, Iontophoresis with 4 mg/ml dexamethasome, Moist heat, traction, Ultrasound, gait training, Therapeutic exercise, balance training, neuromuscular re-education, patient/family education, manual techniques, passive ROM, dry needling, taping, vasopnuematic device, vestibular, spinal manipulations, joint manipulations 97110-Therapeutic exercises, 97530- Therapeutic activity, 97112- Neuromuscular re-education, 97535- Self Care, 47829- Manual therapy, and 97033- Ionotophoresis 4mg /ml Dexamethasone  PLAN FOR NEXT SESSION: DC today, but he may return at any point within 30 days if has set back.   PHYSICAL THERAPY DISCHARGE SUMMARY  Visits from Start of Care: 3  Current functional level related to goals / functional outcomes: See below   Remaining deficits: See below   Education / Equipment: HEP  Plan:  Patient goals were almost all met. Patient is being discharged due to reaching good functional status.     April Manson, PT,DPT 05/24/2023, 8:01 AM

## 2023-06-05 NOTE — Progress Notes (Unsigned)
   Rubin Payor, PhD, LAT, ATC acting as a scribe for Nathaniel Graham, MD.  Nathaniel Mosley is a 63 y.o. male who presents to [facility] at [facility] today for f/u L shoulder pain. Pt was last seen by Dr. Denyse Amass on 04/11/23 and was referred to PT, completing 3 visits.   Today, pt reports ***  Dx imaging: 04/11/23 L shoulder XR  Pertinent review of systems: ***  Relevant historical information: ***   Exam:  There were no vitals taken for this visit. General: Well Developed, well nourished, and in no acute distress.   MSK: ***    Lab and Radiology Results No results found for this or any previous visit (from the past 72 hours). No results found.     Assessment and Plan: 63 y.o. male with ***   PDMP not reviewed this encounter. No orders of the defined types were placed in this encounter.  No orders of the defined types were placed in this encounter.    Discussed warning signs or symptoms. Please see discharge instructions. Patient expresses understanding.   ***

## 2023-06-06 ENCOUNTER — Encounter: Payer: Self-pay | Admitting: Family Medicine

## 2023-06-06 ENCOUNTER — Ambulatory Visit: Payer: 59 | Admitting: Family Medicine

## 2023-06-06 ENCOUNTER — Other Ambulatory Visit: Payer: Self-pay

## 2023-06-06 VITALS — BP 150/96 | HR 80 | Ht 66.0 in | Wt 237.0 lb

## 2023-06-06 DIAGNOSIS — G8929 Other chronic pain: Secondary | ICD-10-CM

## 2023-06-06 DIAGNOSIS — M25512 Pain in left shoulder: Secondary | ICD-10-CM | POA: Diagnosis not present

## 2023-06-06 NOTE — Patient Instructions (Addendum)
 Thank you for coming in today.   Glad you are doing better  Check back as needed

## 2023-06-12 LAB — HM DIABETES EYE EXAM

## 2023-06-23 NOTE — Telephone Encounter (Signed)
 Referral to [facility] already put in place 04/06/23 and 04/11/23

## 2023-07-13 ENCOUNTER — Ambulatory Visit: Admitting: Dermatology

## 2023-07-18 ENCOUNTER — Other Ambulatory Visit: Payer: Self-pay | Admitting: Nurse Practitioner

## 2023-07-18 DIAGNOSIS — B356 Tinea cruris: Secondary | ICD-10-CM

## 2023-07-19 ENCOUNTER — Ambulatory Visit: Payer: 59 | Admitting: Dermatology

## 2023-08-03 ENCOUNTER — Encounter: Payer: Managed Care, Other (non HMO) | Admitting: Nurse Practitioner

## 2023-09-04 ENCOUNTER — Ambulatory Visit: Admitting: Dermatology

## 2023-09-04 DIAGNOSIS — L821 Other seborrheic keratosis: Secondary | ICD-10-CM | POA: Diagnosis not present

## 2023-09-04 DIAGNOSIS — B079 Viral wart, unspecified: Secondary | ICD-10-CM

## 2023-09-04 DIAGNOSIS — L918 Other hypertrophic disorders of the skin: Secondary | ICD-10-CM

## 2023-09-04 DIAGNOSIS — L820 Inflamed seborrheic keratosis: Secondary | ICD-10-CM

## 2023-09-04 DIAGNOSIS — D492 Neoplasm of unspecified behavior of bone, soft tissue, and skin: Secondary | ICD-10-CM

## 2023-09-04 DIAGNOSIS — Z7189 Other specified counseling: Secondary | ICD-10-CM

## 2023-09-04 NOTE — Progress Notes (Unsigned)
 Follow-Up Visit   Subjective  Glendia is a 63 y.o. male who presents for the following: f/u on ISK's treated on his back with LN2 9 months ago, patient c/o skin tags around his neck and under the left axilla.  The patient has spots, moles and lesions to be evaluated, some may be new or changing and the patient may have concern these could be cancer.   The following portions of the chart were reviewed this encounter and updated as appropriate: medications, allergies, medical history  Review of Systems:  No other skin or systemic complaints except as noted in HPI or Assessment and Plan.  Objective  Well appearing patient in no apparent distress; mood and affect are within normal limits.  A focused examination was performed of the following areas: Back, arms   Relevant exam findings are noted in the Assessment and Plan.  back x 18 (18) Stuck-on, waxy, tan-brown papules and plaques -- Discussed benign etiology and prognosis.  right ear 0.4 cm flesh papule  Assessment & Plan   INFLAMED SEBORRHEIC KERATOSIS (18) back x 18 (18) Symptomatic, irritating, patient would like treated.  Destruction of lesion - back x 18 (18) Complexity: simple   Destruction method: cryotherapy   Informed consent: discussed and consent obtained   Timeout:  patient name, date of birth, surgical site, and procedure verified Lesion destroyed using liquid nitrogen: Yes   Region frozen until ice ball extended beyond lesion: Yes   Outcome: patient tolerated procedure well with no complications   Post-procedure details: wound care instructions given    NEOPLASM OF SKIN right ear Epidermal / dermal shaving  Lesion diameter (cm):  0.4 Informed consent: discussed and consent obtained   Timeout: patient name, date of birth, surgical site, and procedure verified   Procedure prep:  Patient was prepped and draped in usual sterile fashion Prep type:  Isopropyl alcohol Anesthesia: the lesion was anesthetized in a  standard fashion   Anesthetic:  1% lidocaine  w/ epinephrine  1-100,000 buffered w/ 8.4% NaHCO3 Hemostasis achieved with: pressure, aluminum chloride and electrodesiccation   Outcome: patient tolerated procedure well   Post-procedure details: sterile dressing applied and wound care instructions given   Dressing type: bandage and petrolatum    Specimen 1 - Surgical pathology Differential Diagnosis: R/O Fibrous papule vs nevus   Check Margins: No  SEBORRHEIC KERATOSIS - Stuck-on, waxy, tan-brown papules and/or plaques  - Benign-appearing - Discussed benign etiology and prognosis. - Observe - Call for any changes  ACROCHORDONS (Skin Tags) - Removal desired by patient - Fleshy, skin-colored pedunculated papules - Benign appearing.  - Patient desires removal. Reviewed that this is not covered by insurance and they will be charged a cosmetic fee for removal. Patient signed non-covered consent.  - Prior to procedure, discussed risks of blister formation, small wound, skin dyspigmentation, or rare scar following cryotherapy.   Destruction Procedure Note Destruction method: cryotherapy   Informed consent: discussed and consent obtained   Lesion destroyed using liquid nitrogen: Yes   Outcome: patient tolerated procedure well with no complications   Post-procedure details: wound care instructions given   Locations: left axilla x 8, neck x 7 # of Lesions Treated: 15     Return in about 1 year (around 09/03/2024) for TBSE, ISKs, skin tags .  IFay Kirks, CMA, am acting as scribe for Alm Rhyme, MD .   Documentation: I have reviewed the above documentation for accuracy and completeness, and I agree with the above.  Alm Rhyme, MD

## 2023-09-04 NOTE — Patient Instructions (Addendum)

## 2023-09-05 ENCOUNTER — Encounter: Payer: Self-pay | Admitting: Dermatology

## 2023-09-06 LAB — SURGICAL PATHOLOGY

## 2023-09-07 ENCOUNTER — Ambulatory Visit: Payer: Self-pay | Admitting: Dermatology

## 2023-09-07 NOTE — Telephone Encounter (Signed)
-----   Message from Alm Rhyme sent at 09/07/2023 10:59 AM EDT ----- FINAL DIAGNOSIS        1. Skin, right ear :       VERRUCA VULGARIS   Benign viral wart May recur No further treatment needed unless recurs ----- Message ----- From: Interface, Lab In [facility] Sent: 09/06/2023   6:46 PM EDT To: [name], MD

## 2023-09-07 NOTE — Telephone Encounter (Signed)
 Discussed biopsy results with patient

## 2023-10-05 ENCOUNTER — Encounter: Admitting: Nurse Practitioner

## 2023-11-09 ENCOUNTER — Ambulatory Visit: Admitting: Nurse Practitioner

## 2023-11-09 ENCOUNTER — Encounter: Payer: Self-pay | Admitting: Nurse Practitioner

## 2023-11-09 ENCOUNTER — Ambulatory Visit: Payer: Self-pay | Admitting: Nurse Practitioner

## 2023-11-09 VITALS — BP 136/84 | HR 69 | Temp 98.0°F | Wt 230.5 lb

## 2023-11-09 DIAGNOSIS — I10 Essential (primary) hypertension: Secondary | ICD-10-CM

## 2023-11-09 DIAGNOSIS — E6609 Other obesity due to excess calories: Secondary | ICD-10-CM

## 2023-11-09 DIAGNOSIS — K219 Gastro-esophageal reflux disease without esophagitis: Secondary | ICD-10-CM | POA: Insufficient documentation

## 2023-11-09 DIAGNOSIS — Z0001 Encounter for general adult medical examination with abnormal findings: Secondary | ICD-10-CM

## 2023-11-09 DIAGNOSIS — Z125 Encounter for screening for malignant neoplasm of prostate: Secondary | ICD-10-CM

## 2023-11-09 DIAGNOSIS — Z23 Encounter for immunization: Secondary | ICD-10-CM | POA: Diagnosis not present

## 2023-11-09 DIAGNOSIS — H9313 Tinnitus, bilateral: Secondary | ICD-10-CM | POA: Diagnosis not present

## 2023-11-09 DIAGNOSIS — E785 Hyperlipidemia, unspecified: Secondary | ICD-10-CM | POA: Diagnosis not present

## 2023-11-09 DIAGNOSIS — E66812 Obesity, class 2: Secondary | ICD-10-CM

## 2023-11-09 DIAGNOSIS — E119 Type 2 diabetes mellitus without complications: Secondary | ICD-10-CM | POA: Diagnosis not present

## 2023-11-09 DIAGNOSIS — E781 Pure hyperglyceridemia: Secondary | ICD-10-CM

## 2023-11-09 DIAGNOSIS — Z6836 Body mass index (BMI) 36.0-36.9, adult: Secondary | ICD-10-CM

## 2023-11-09 LAB — CBC
HCT: 47.7 % (ref 39.0–52.0)
Hemoglobin: 15.9 g/dL (ref 13.0–17.0)
MCHC: 33.3 g/dL (ref 30.0–36.0)
MCV: 87.6 fl (ref 78.0–100.0)
Platelets: 223 K/uL (ref 150.0–400.0)
RBC: 5.45 Mil/uL (ref 4.22–5.81)
RDW: 14.6 % (ref 11.5–15.5)
WBC: 4.7 K/uL (ref 4.0–10.5)

## 2023-11-09 LAB — COMPREHENSIVE METABOLIC PANEL WITH GFR
ALT: 16 U/L (ref 0–53)
AST: 22 U/L (ref 0–37)
Albumin: 4.6 g/dL (ref 3.5–5.2)
Alkaline Phosphatase: 66 U/L (ref 39–117)
BUN: 17 mg/dL (ref 6–23)
CO2: 27 meq/L (ref 19–32)
Calcium: 9.5 mg/dL (ref 8.4–10.5)
Chloride: 101 meq/L (ref 96–112)
Creatinine, Ser: 0.92 mg/dL (ref 0.40–1.50)
GFR: 89.41 mL/min (ref >60.00)
Glucose, Bld: 128 mg/dL — ABNORMAL HIGH (ref 70–99)
Potassium: 3.8 meq/L (ref 3.5–5.1)
Sodium: 141 meq/L (ref 135–145)
Total Bilirubin: 0.7 mg/dL (ref 0.2–1.2)
Total Protein: 7.5 g/dL (ref 6.0–8.3)

## 2023-11-09 LAB — HEMOGLOBIN A1C: Hgb A1c MFr Bld: 7.6 % — ABNORMAL HIGH (ref 4.6–6.5)

## 2023-11-09 LAB — LIPID PANEL
Cholesterol: 189 mg/dL (ref 0–200)
HDL: 35.5 mg/dL — ABNORMAL LOW (ref >39.00)
LDL Cholesterol: 106 mg/dL — ABNORMAL HIGH (ref 0–99)
NonHDL: 153.85
Total CHOL/HDL Ratio: 5
Triglycerides: 238 mg/dL — ABNORMAL HIGH (ref 0.0–149.0)
VLDL: 47.6 mg/dL — ABNORMAL HIGH (ref 0.0–40.0)

## 2023-11-09 LAB — MICROALBUMIN / CREATININE URINE RATIO
Creatinine,U: 78 mg/dL
Microalb Creat Ratio: 86.9 mg/g — ABNORMAL HIGH (ref 0.0–30.0)
Microalb, Ur: 6.8 mg/dL — ABNORMAL HIGH (ref 0.0–1.9)

## 2023-11-09 LAB — TSH: TSH: 3.54 u[IU]/mL (ref 0.35–5.50)

## 2023-11-09 LAB — PSA: PSA: 0.91 ng/mL (ref 0.10–4.00)

## 2023-11-09 NOTE — Assessment & Plan Note (Signed)
 Labs ordered, further recommendations may be made based upon his results.

## 2023-11-09 NOTE — Assessment & Plan Note (Signed)
 Obesity Weight loss of 7 pounds since last visit. Potential impact of weight loss on overall health, including diabetes and kidney function, discussed. - Encourage continued weight loss and healthy lifestyle.

## 2023-11-09 NOTE — Assessment & Plan Note (Signed)
 Hyperlipidemia Hyperlipidemia not specifically discussed, but cholesterol levels will be checked as part of routine labs. - Check cholesterol levels today.

## 2023-11-09 NOTE — Assessment & Plan Note (Signed)
 Essential hypertension Blood pressure improved, likely due to retirement and reduced work stress. Potential link between sleep apnea and hypertension discussed. - Monitor blood pressure. - Consider sleep apnea testing if symptoms or blood pressure worsen. Patient declines offer for referral for sleep study at this time.

## 2023-11-09 NOTE — Addendum Note (Signed)
 Addended by: LEAR, Donna Snooks P on: 11/09/2023 10:34 AM   Modules accepted: Orders

## 2023-11-09 NOTE — Assessment & Plan Note (Signed)
 Annual physical examination Annual physical examination conducted. Health maintenance discussed, including colonoscopy, flu shot, tetanus vaccine, pneumonia vaccine, and COVID-19 vaccine. Potential side effects of flu and pneumonia vaccines discussed. - Request records for colonoscopy from Dr. Kristie. - Administer flu shot today. VIS provided - Administer pneumonia vaccine today. Vis provided - Recommend tetanus vaccine at pharmacy. - He will notify me if he wants Covid vaccine, declines at this time -Information sheet provided for health maintenance  -Labs ordered, further recommendations may be made based upon his results

## 2023-11-09 NOTE — Assessment & Plan Note (Signed)
 Tinnitus Persistent tinnitus, possibly related to snoring. Potential link with sleep apnea discussed. - Consider sleep apnea testing if symptoms persist or worsen. -No other clinical evidence for Meniere's disease, continue to monitor

## 2023-11-09 NOTE — Assessment & Plan Note (Signed)
 Type 2 diabetes mellitus Type 2 diabetes without complication. Blood sugars well controlled with A1c consistently under 7. Importance of blood sugar control for kidney health discussed. - Check A1c today. - Monitor blood sugar control. -Foot exam completed -Check for albuminuria -Consider initiation of ACEI/ARB or SGLT2 pending lab results.

## 2023-11-09 NOTE — Progress Notes (Signed)
 Complete physical exam  Patient: Nathaniel Mosley   DOB: 1961/02/28   63 y.o. Male  MRN: [id number]  Subjective:    Chief Complaint  Patient presents with   Annual Exam    Nathaniel Mosley is a 63 y.o. male who presents today for a complete physical exam.   Discussed the use of AI scribe software for clinical note transcription with the patient, who gave verbal consent to proceed.  History of Present Illness Nathaniel Mosley is a 63 year old male who presents for an annual physical exam.  Immunization history and reactions - Colonoscopy performed in December 2024 with recommendation to repeat in seven years - Sore shoulder typically occurs after influenza vaccination - Last tetanus vaccination in 2014 - No history of pneumococcal vaccination - Received shingles vaccination - Aware that COVID-19 vaccination now requires a prescription  Otolaryngologic symptoms - Tinnitus present, associated with snoring - No evaluation for sleep apnea - Feels rested upon waking - Tinnitus improves as day progresses -No dizziness/visual changes associated with it  Weight and activity changes - Retired and physically active in yard work - [employer] loss attributed to increased activity    Most recent fall risk assessment:    02/02/2023    9:04 AM  Fall Risk   Falls in the past year? 0  Number falls in past yr: 0  Injury with Fall? 0  Risk for fall due to : No Fall Risks  Follow up Falls evaluation completed     Most recent depression screenings:    02/02/2023   10:27 AM 02/02/2023    9:04 AM  PHQ 2/9 Scores  PHQ - 2 Score 0 0      Past Medical History:  Diagnosis Date   Allergy    History of kidney stones    Hypertension    Kidney stone on left side    Past Surgical History:  Procedure Laterality Date   EXTRACORPOREAL SHOCK WAVE LITHOTRIPSY Left 08/13/2018   Procedure: EXTRACORPOREAL SHOCK WAVE LITHOTRIPSY (ESWL);  Surgeon:  Cam Morene ORN, MD;  Location: [facility];  Service: Urology;  Laterality: Left;   FRACTURE SURGERY     right elbow- 20 + years ago   OPEN REPAIR PERIARTICULAR FRACTURE / DISLOCATION ELBOW  1998   PLANTAR FASCIA RELEASE Bilateral 2013   right cheek  8 years ago   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   TONSILLECTOMY     as a child   Social History   Socioeconomic History   Marital status: Single    Spouse name: Not on file   Number of children: 0   Years of education: Not on file   Highest education level: Some college, no degree  Occupational History   Occupation: Merchandiser, retail: [employer]  Tobacco Use   Smoking status: Never   Smokeless tobacco: Never  Vaping Use   Vaping status: Never Used  Substance and Sexual Activity   Alcohol use: No   Drug use: No   Sexual activity: Not Currently    Birth control/protection: Abstinence  Other Topics Concern   Not on file  Social History Narrative   Not on file   Social Drivers of Health   Financial Resource Strain: Low Risk  (11/08/2023)   Overall Financial Resource Strain (CARDIA)    Difficulty of Paying Living Expenses: Not hard at all  Food Insecurity: Unknown (11/08/2023)   Hunger Vital Sign    Worried About  Running Out of Food in the Last Year: Not on file    Ran Out of Food in the Last Year: Never true  Transportation Needs: No Transportation Needs (11/08/2023)   PRAPARE - Administrator, Civil Service (Medical): No    Lack of Transportation (Non-Medical): No  Physical Activity: Sufficiently Active (11/08/2023)   Exercise Vital Sign    Days of Exercise per Week: 5 days    Minutes of Exercise per Session: 30 min  Stress: No Stress Concern Present (11/08/2023)   [employer] of Occupational Health - Occupational Stress Questionnaire    Feeling of Stress: Not at all  Social Connections: Socially Isolated (11/08/2023)   Social Connection and Isolation Panel    Frequency of  Communication with Friends and Family: Once a week    Frequency of Social Gatherings with Friends and Family: Once a week    Attends Religious Services: Never    Database administrator or Organizations: Yes    Attends Banker Meetings: 1 to 4 times per year    Marital Status: Never married  Catering manager Violence: Not on file   Family History  Problem Relation Age of Onset   Cancer Mother    Diabetes Father    Allergies  Allergen Reactions   Ace Inhibitors     Cough   Azithromycin Other (See Comments)    GI upset      Patient Care Team: Elnor Lauraine BRAVO, NP as PCP - General (Nurse Practitioner) Kristie Lamprey, MD as Consulting Physician (Gastroenterology)   Outpatient Medications Prior to Visit  Medication Sig   amLODipine  (NORVASC ) 10 MG tablet Take 1 tablet (10 mg total) by mouth daily.   cetirizine (ZYRTEC) 10 MG tablet Cetirizine   [DISCONTINUED] Ciclopirox  0.77 % gel APPLY TO AFFECTED AREA TWICE A DAY   [DISCONTINUED] clotrimazole -betamethasone  (LOTRISONE ) cream APPLY 1 APPLICATION TOPICALLY DAILY   [DISCONTINUED] mometasone  (ELOCON ) 0.1 % cream Apply 1 Application topically as directed. Bid 5 days a week to aa rash until clear, then prn flares.   No facility-administered medications prior to visit.    Review of Systems  Constitutional:  Negative for chills and fever.  HENT:  Positive for tinnitus. Negative for hearing loss.   Eyes:  Negative for blurred vision and double vision.  Respiratory:  Negative for cough and shortness of breath.   Cardiovascular:  Negative for chest pain and palpitations.  Gastrointestinal:  Negative for abdominal pain and blood in stool.  Neurological:  Negative for seizures and loss of consciousness.  Psychiatric/Behavioral:  Negative for depression. The patient is not nervous/anxious and does not have insomnia.           Objective:     BP 136/84   Pulse 69   Temp 98 F (36.7 C) (Temporal)   Wt 230 lb 8 oz (104.6  kg)   SpO2 93%   BMI 37.20 kg/m  BP Readings from Last 3 Encounters:  11/09/23 136/84  06/06/23 (!) 150/96  04/11/23 (!) 156/94   Wt Readings from Last 3 Encounters:  11/09/23 230 lb 8 oz (104.6 kg)  06/06/23 237 lb (107.5 kg)  04/11/23 233 lb (105.7 kg)      Physical Exam Vitals reviewed.  Constitutional:      General: He is not in acute distress.    Appearance: Normal appearance. He is not ill-appearing.  HENT:     Head: Normocephalic and atraumatic.     Right Ear: Tympanic membrane, ear canal  and external ear normal.     Left Ear: Tympanic membrane, ear canal and external ear normal.  Eyes:     General: No scleral icterus.    Extraocular Movements: Extraocular movements intact.     Conjunctiva/sclera: Conjunctivae normal.     Pupils: Pupils are equal, round, and reactive to light.  Neck:     Vascular: No carotid bruit.  Cardiovascular:     Rate and Rhythm: Normal rate and regular rhythm.     Pulses: Normal pulses.     Heart sounds: Normal heart sounds.  Pulmonary:     Effort: Pulmonary effort is normal.     Breath sounds: Normal breath sounds.  Abdominal:     General: Bowel sounds are normal. There is no distension.     Palpations: There is no mass.     Tenderness: There is no abdominal tenderness.     Hernia: No hernia is present.  Musculoskeletal:        General: No swelling or tenderness.     Cervical back: Normal range of motion and neck supple. No rigidity.  Lymphadenopathy:     Cervical: No cervical adenopathy.  Skin:    General: Skin is warm and dry.  Neurological:     General: No focal deficit present.     Mental Status: He is alert and oriented to person, place, and time.     Cranial Nerves: No cranial nerve deficit.     Sensory: No sensory deficit.     Motor: No weakness.     Gait: Gait normal.  Psychiatric:        Mood and Affect: Mood normal.        Behavior: Behavior normal.        Judgment: Judgment normal.     Diabetic Foot Exam -  Simple   Simple Foot Form Diabetic Foot exam was performed with the following findings: Yes 11/09/2023  8:30 AM  Visual Inspection No deformities, no ulcerations, no other skin breakdown bilaterally: Yes Sensation Testing Intact to touch and monofilament testing bilaterally: Yes Pulse Check Posterior Tibialis and Dorsalis pulse intact bilaterally: Yes Comments      No results found for any visits on 11/09/23.     Assessment & Plan:    Routine Health Maintenance and Physical Exam  Immunization History  Administered Date(s) Administered   Influenza,inj,Quad PF,6+ Mos 01/22/2019   Influenza-Unspecified 11/29/2014, 10/29/2020   PFIZER(Purple Top)SARS-COV-2 Vaccination 04/11/2019, 05/06/2019, 03/06/2020   Td 12/16/2010   Tdap 02/26/2013   Zoster Recombinant(Shingrix ) 07/01/2022, 09/12/2022    Health Maintenance  Topic Date Due   Pneumococcal Vaccine: 50+ Years (1 of 2 - PCV) Never done   Diabetic kidney evaluation - Urine ACR  01/13/2019   Colonoscopy  02/15/2023   DTaP/Tdap/Td (3 - Td or Tdap) 02/27/2023   Diabetic kidney evaluation - eGFR measurement  07/01/2023   HEMOGLOBIN A1C  08/03/2023   Influenza Vaccine  09/29/2023   COVID-19 Vaccine (4 - 2025-26 season) 11/25/2023 (Originally 10/30/2023)   OPHTHALMOLOGY EXAM  06/11/2024   FOOT EXAM  11/08/2024   Hepatitis C Screening  Completed   HIV Screening  Completed   Zoster Vaccines- Shingrix   Completed   Hepatitis B Vaccines 19-59 Average Risk  Aged Out   HPV VACCINES  Aged Out   Meningococcal B Vaccine  Aged Out    Discussed health benefits of physical activity, and encouraged him to engage in regular exercise appropriate for his age and condition.  Problem List Items Addressed This  Visit       Cardiovascular and Mediastinum   Essential hypertension   Essential hypertension Blood pressure improved, likely due to retirement and reduced work stress. Potential link between sleep apnea and hypertension discussed. -  Monitor blood pressure. - Consider sleep apnea testing if symptoms or blood pressure worsen. Patient declines offer for referral for sleep study at this time.       Relevant Orders   CBC   Comprehensive metabolic panel with GFR   Hemoglobin A1c   Lipid panel   TSH   Microalbumin / creatinine urine ratio   PSA     Endocrine   Type 2 diabetes mellitus without complication, without long-term current use of insulin (HCC)   Type 2 diabetes mellitus Type 2 diabetes without complication. Blood sugars well controlled with A1c consistently under 7. Importance of blood sugar control for kidney health discussed. - Check A1c today. - Monitor blood sugar control. -Foot exam completed -Check for albuminuria -Consider initiation of ACEI/ARB or SGLT2 pending lab results.       Relevant Orders   CBC   Comprehensive metabolic panel with GFR   Hemoglobin A1c   Lipid panel   TSH   Microalbumin / creatinine urine ratio   PSA     Other   Obesity   Obesity Weight loss of 7 pounds since last visit. Potential impact of weight loss on overall health, including diabetes and kidney function, discussed. - Encourage continued weight loss and healthy lifestyle.      Relevant Orders   CBC   Comprehensive metabolic panel with GFR   Hemoglobin A1c   Lipid panel   TSH   Microalbumin / creatinine urine ratio   PSA   Encounter for general adult medical examination with abnormal findings - Primary   Annual physical examination Annual physical examination conducted. Health maintenance discussed, including colonoscopy, flu shot, tetanus vaccine, pneumonia vaccine, and COVID-19 vaccine. Potential side effects of flu and pneumonia vaccines discussed. - Request records for colonoscopy from Dr. Kristie. - Administer flu shot today. VIS provided - Administer pneumonia vaccine today. Vis provided - Recommend tetanus vaccine at pharmacy. - He will notify me if he wants Covid vaccine, declines at this  time -Information sheet provided for health maintenance  -Labs ordered, further recommendations may be made based upon his results      Relevant Orders   CBC   Comprehensive metabolic panel with GFR   Hemoglobin A1c   Lipid panel   TSH   Microalbumin / creatinine urine ratio   PSA   Hypertriglyceridemia   Relevant Orders   CBC   Comprehensive metabolic panel with GFR   Hemoglobin A1c   Lipid panel   TSH   Microalbumin / creatinine urine ratio   PSA   Tinnitus of both ears   Tinnitus Persistent tinnitus, possibly related to snoring. Potential link with sleep apnea discussed. - Consider sleep apnea testing if symptoms persist or worsen. -No other clinical evidence for Meniere's disease, continue to monitor      Relevant Orders   CBC   Comprehensive metabolic panel with GFR   Hemoglobin A1c   Lipid panel   TSH   Microalbumin / creatinine urine ratio   PSA   Prostate cancer screening   Labs ordered, further recommendations may be made based upon his results       Relevant Orders   PSA   Hyperlipidemia   Hyperlipidemia Hyperlipidemia not specifically discussed, but cholesterol levels will be checked  as part of routine labs. - Check cholesterol levels today.      Relevant Orders   CBC   Comprehensive metabolic panel with GFR   Hemoglobin A1c   Lipid panel   TSH   Microalbumin / creatinine urine ratio   PSA   Return in about 6 months (around 05/08/2024) for F/U with Scarlett Portlock.     Assessment and Plan Assessment & Plan Annual physical examination Annual physical examination conducted. Health maintenance discussed, including colonoscopy, flu shot, tetanus vaccine, pneumonia vaccine, and COVID-19 vaccine. Potential side effects of flu and pneumonia vaccines discussed. - Request records for colonoscopy from Dr. Kristie. - Administer flu shot today. VIS provided - Administer pneumonia vaccine today. Vis provided - Recommend tetanus vaccine at pharmacy. - He will  notify me if he wants Covid vaccine, declines at this time -Information sheet provided for health maintenance  -Labs ordered, further recommendations may be made based upon his results   Essential hypertension Blood pressure improved, likely due to retirement and reduced work stress. Potential link between sleep apnea and hypertension discussed. - Monitor blood pressure. - Consider sleep apnea testing if symptoms or blood pressure worsen. Patient declines offer for referral for sleep study at this time.   Type 2 diabetes mellitus Type 2 diabetes without complication. Blood sugars well controlled with A1c consistently under 7. Importance of blood sugar control for kidney health discussed. - Check A1c today. - Monitor blood sugar control. -Foot exam completed -Check for albuminuria -Consider initiation of ACEI/ARB or SGLT2 pending lab results.   Tinnitus Persistent tinnitus, possibly related to snoring. Potential link with sleep apnea discussed. - Consider sleep apnea testing if symptoms persist or worsen. -No other clinical evidence for Meniere's disease, continue to monitor  Obesity Weight loss of 7 pounds since last visit. Potential impact of weight loss on overall health, including diabetes and kidney function, discussed. - Encourage continued weight loss and healthy lifestyle.  Hyperlipidemia Hyperlipidemia not specifically discussed, but cholesterol levels will be checked as part of routine labs. - Check cholesterol levels today.  Follow-Up Follow-up discussed for monitoring blood sugars and potential kidney issues. - Schedule follow-up in 6 months to check blood sugars and kidney function.   In addition to performing our annual physical exam also performed office visit as detailed above   Lauraine FORBES Pereyra, NP

## 2024-01-24 ENCOUNTER — Other Ambulatory Visit: Payer: Self-pay | Admitting: Nurse Practitioner

## 2024-01-26 ENCOUNTER — Other Ambulatory Visit: Payer: Self-pay | Admitting: Nurse Practitioner

## 2024-01-26 DIAGNOSIS — I10 Essential (primary) hypertension: Secondary | ICD-10-CM

## 2024-04-03 ENCOUNTER — Other Ambulatory Visit: Payer: Self-pay | Admitting: Nurse Practitioner

## 2024-04-03 DIAGNOSIS — B356 Tinea cruris: Secondary | ICD-10-CM

## 2024-04-04 ENCOUNTER — Encounter: Payer: Self-pay | Admitting: Nurse Practitioner

## 2024-04-04 ENCOUNTER — Other Ambulatory Visit: Payer: Self-pay | Admitting: Nurse Practitioner

## 2024-04-04 DIAGNOSIS — B356 Tinea cruris: Secondary | ICD-10-CM

## 2024-04-04 MED ORDER — CLOTRIMAZOLE-BETAMETHASONE 1-0.05 % EX CREA: TOPICAL_CREAM | Freq: Every day | CUTANEOUS | 1 refills | Status: AC

## 2024-04-04 MED ORDER — CICLOPIROX 0.77 % EX GEL: 1.0000 | Freq: Two times a day (BID) | CUTANEOUS | 1 refills | Status: AC

## 2024-05-09 ENCOUNTER — Ambulatory Visit: Admitting: Nurse Practitioner

## 2024-09-03 ENCOUNTER — Ambulatory Visit: Admitting: Dermatology
